# Patient Record
Sex: Female | Born: 1955
Health system: Southern US, Community
[De-identification: ages and names within clinical notes are randomized; demographics above are authoritative.]

## PROBLEM LIST (undated history)

## (undated) DIAGNOSIS — F329 Major depressive disorder, single episode, unspecified: Secondary | ICD-10-CM

## (undated) DIAGNOSIS — K219 Gastro-esophageal reflux disease without esophagitis: Secondary | ICD-10-CM

## (undated) DIAGNOSIS — F419 Anxiety disorder, unspecified: Secondary | ICD-10-CM

## (undated) DIAGNOSIS — M1990 Unspecified osteoarthritis, unspecified site: Secondary | ICD-10-CM

## (undated) DIAGNOSIS — E7800 Pure hypercholesterolemia, unspecified: Secondary | ICD-10-CM

## (undated) DIAGNOSIS — F32A Depression, unspecified: Secondary | ICD-10-CM

## (undated) HISTORY — DX: Anxiety disorder, unspecified: F41.9

## (undated) HISTORY — PX: ABDOMINAL HYSTERECTOMY: SHX81

## (undated) HISTORY — DX: Unspecified osteoarthritis, unspecified site: M19.90

---

## 2011-02-16 DIAGNOSIS — R269 Unspecified abnormalities of gait and mobility: Secondary | ICD-10-CM | POA: Insufficient documentation

## 2011-12-30 DIAGNOSIS — Z860100 Personal history of colon polyps, unspecified: Secondary | ICD-10-CM | POA: Insufficient documentation

## 2013-12-30 DIAGNOSIS — M8580 Other specified disorders of bone density and structure, unspecified site: Secondary | ICD-10-CM | POA: Insufficient documentation

## 2014-12-01 LAB — HM COLONOSCOPY

## 2016-09-02 ENCOUNTER — Emergency Department: Payer: BC Managed Care – PPO

## 2016-09-02 ENCOUNTER — Emergency Department
Admission: EM | Admit: 2016-09-02 | Discharge: 2016-09-02 | Disposition: A | Discharge status: Home / Self Care | Payer: BC Managed Care – PPO | Attending: Emergency Medicine | Admitting: Emergency Medicine

## 2016-09-02 ENCOUNTER — Encounter: Payer: Self-pay | Admitting: Emergency Medicine

## 2016-09-02 DIAGNOSIS — H538 Other visual disturbances: Secondary | ICD-10-CM | POA: Insufficient documentation

## 2016-09-02 DIAGNOSIS — R202 Paresthesia of skin: Secondary | ICD-10-CM | POA: Insufficient documentation

## 2016-09-02 DIAGNOSIS — G44009 Cluster headache syndrome, unspecified, not intractable: Secondary | ICD-10-CM | POA: Diagnosis not present

## 2016-09-02 DIAGNOSIS — R519 Headache, unspecified: Secondary | ICD-10-CM

## 2016-09-02 DIAGNOSIS — R51 Headache: Secondary | ICD-10-CM

## 2016-09-02 DIAGNOSIS — R29810 Facial weakness: Secondary | ICD-10-CM | POA: Diagnosis present

## 2016-09-02 HISTORY — DX: Depression, unspecified: F32.A

## 2016-09-02 HISTORY — DX: Gastro-esophageal reflux disease without esophagitis: K21.9

## 2016-09-02 HISTORY — DX: Major depressive disorder, single episode, unspecified: F32.9

## 2016-09-02 HISTORY — DX: Pure hypercholesterolemia, unspecified: E78.00

## 2016-09-02 LAB — COMPREHENSIVE METABOLIC PANEL
ALBUMIN: 4.4 g/dL (ref 3.5–5.0)
ALK PHOS: 99 U/L (ref 38–126)
ALT: 24 U/L (ref 14–54)
AST: 32 U/L (ref 15–41)
Anion gap: 7 (ref 5–15)
BILIRUBIN TOTAL: 1.3 mg/dL — AB (ref 0.3–1.2)
BUN: 10 mg/dL (ref 6–20)
CALCIUM: 9.3 mg/dL (ref 8.9–10.3)
CO2: 25 mmol/L (ref 22–32)
CREATININE: 0.93 mg/dL (ref 0.44–1.00)
Chloride: 106 mmol/L (ref 101–111)
GFR calc Af Amer: >60 mL/min (ref >60)
GLUCOSE: 90 mg/dL (ref 65–99)
Potassium: 4.5 mmol/L (ref 3.5–5.1)
Sodium: 138 mmol/L (ref 135–145)
TOTAL PROTEIN: 8 g/dL (ref 6.5–8.1)

## 2016-09-02 LAB — URINALYSIS, ROUTINE W REFLEX MICROSCOPIC
BILIRUBIN URINE: NEGATIVE
Bacteria, UA: NONE SEEN
Glucose, UA: NEGATIVE mg/dL
Ketones, ur: NEGATIVE mg/dL
Nitrite: NEGATIVE
Protein, ur: NEGATIVE mg/dL
SPECIFIC GRAVITY, URINE: 1.002 — AB (ref 1.005–1.030)
pH: 6 (ref 5.0–8.0)

## 2016-09-02 LAB — CBC
HEMATOCRIT: 42.7 % (ref 35.0–47.0)
HEMOGLOBIN: 14.2 g/dL (ref 12.0–16.0)
MCH: 29.7 pg (ref 26.0–34.0)
MCHC: 33.3 g/dL (ref 32.0–36.0)
MCV: 89.2 fL (ref 80.0–100.0)
Platelets: 232 10*3/uL (ref 150–440)
RBC: 4.79 MIL/uL (ref 3.80–5.20)
RDW: 14.1 % (ref 11.5–14.5)
WBC: 6.1 10*3/uL (ref 3.6–11.0)

## 2016-09-02 LAB — PROTIME-INR
INR: 0.94
Prothrombin Time: 12.6 seconds (ref 11.4–15.2)

## 2016-09-02 LAB — APTT: aPTT: 33 seconds (ref 24–36)

## 2016-09-02 LAB — DIFFERENTIAL
BASOS ABS: 0 10*3/uL (ref 0–0.1)
Basophils Relative: 1 %
Eosinophils Absolute: 0.1 10*3/uL (ref 0–0.7)
Eosinophils Relative: 1 %
LYMPHS ABS: 2.1 10*3/uL (ref 1.0–3.6)
LYMPHS PCT: 34 %
MONOS PCT: 8 %
Monocytes Absolute: 0.5 10*3/uL (ref 0.2–0.9)
Neutro Abs: 3.4 10*3/uL (ref 1.4–6.5)
Neutrophils Relative %: 56 %

## 2016-09-02 LAB — ETHANOL: Alcohol, Ethyl (B): <5 mg/dL (ref <5)

## 2016-09-02 MED ORDER — PROCHLORPERAZINE EDISYLATE 5 MG/ML IJ SOLN
10.0000 mg | Freq: Once | INTRAMUSCULAR | Status: AC
  Administered 2016-09-02: 10 mg via INTRAVENOUS
  Filled 2016-09-02: qty 2

## 2016-09-02 MED ORDER — PROCHLORPERAZINE MALEATE 10 MG PO TABS: 10.0000 mg | ORAL_TABLET | Freq: Three times a day (TID) | ORAL | 0 refills | Status: DC | PRN

## 2016-09-02 MED ORDER — CEPHALEXIN 500 MG PO CAPS: 500.0000 mg | ORAL_CAPSULE | Freq: Four times a day (QID) | ORAL | 0 refills | Status: AC

## 2016-09-02 NOTE — ED Provider Notes (Signed)
[facility] Emergency Department Provider Note   ____________________________________________   I have reviewed the triage vital signs and the nursing notes.   HISTORY  Chief Complaint Facial Droop; Tingling; and Blurred Vision   History limited by: Not Limited   HPI Sherry FordyceCatherine Alvillar is a 61 y.o. female who presents to the emergency department today from ophthalmology office because of concerns for possible stroke. The patient states that her symptoms started yesterday. It started with about 30 minutes of wavy lines in her field of vision. That has since resolved and shortly afterward she developed a headache. It is located in the right top part of her head. This has been persistent since it started. Patient then is started feeling numbness on the right side of her body. She denies any weakness or change in coordination. Patient denies any history of migraines. She called ophthalmology yesterday had an appointment with him today. They dilated her eyes. They told her to come to the emergency department. The patient denies any recent illness.    Past Medical History:  Diagnosis Date  . Depression   . Elevated cholesterol   . GERD (gastroesophageal reflux disease)     There are no active problems to display for this patient.   Past Surgical History:  Procedure Laterality Date  . ABDOMINAL HYSTERECTOMY      Prior to Admission medications   Not on File    Allergies Biaxin [clarithromycin]  No family history on file.  Social History Social History  Substance Use Topics  . Smoking status: Not on file  . Smokeless tobacco: Not on file  . Alcohol use Not on file    Review of Systems Constitutional: No fever/chills Eyes: No visual changes. ENT: No sore throat. Cardiovascular: Denies chest pain. Respiratory: Denies shortness of breath. Gastrointestinal: No abdominal pain.  No nausea, no vomiting.  No diarrhea.   Genitourinary: Negative for  dysuria. Musculoskeletal: Negative for back pain. Skin: Negative for rash. Neurological: Positive for headache, right sided numbness.  ____________________________________________   PHYSICAL EXAM:  VITAL SIGNS: ED Triage Vitals  Enc Vitals Group     BP 09/02/16 1125 (!) 159/69     Pulse Rate 09/02/16 1125 63     Resp 09/02/16 1125 18     Temp 09/02/16 1125 97.7 F (36.5 C)     Temp Source 09/02/16 1125 Oral     SpO2 09/02/16 1125 97 %     Weight 09/02/16 1125 162 lb (73.5 kg)     Height 09/02/16 1125 5' 3.5" (1.613 m)     Head Circumference --      Peak Flow --      Pain Score 09/02/16 1124 4     Pain Loc --    Constitutional: Alert and oriented. Well appearing and in no distress. Eyes: Conjunctivae are normal.  ENT   Head: Normocephalic and atraumatic.   Nose: No congestion/rhinnorhea.   Mouth/Throat: Mucous membranes are moist.   Neck: No stridor. Hematological/Lymphatic/Immunilogical: No cervical lymphadenopathy. Cardiovascular: Normal rate, regular rhythm.  No murmurs, rubs, or gallops.  Respiratory: Normal respiratory effort without tachypnea nor retractions. Breath sounds are clear and equal bilaterally. No wheezes/rales/rhonchi. Gastrointestinal: Soft and non tender. No rebound. No guarding.  Genitourinary: Deferred Musculoskeletal: Normal range of motion in all extremities. No lower extremity edema. Neurologic:  Normal speech and language. Face symmetric. Strength 5/5 in upper and lower extremities. Subjective decrease in sensation on the right side. Finger to nose normal. Heel over shin normal.  Skin:  Skin is warm, dry and intact. No rash noted. Psychiatric: Mood and affect are normal. Speech and behavior are normal. Patient exhibits appropriate insight and judgment.  ____________________________________________     LABS (pertinent positives/negatives)  Labs Reviewed  COMPREHENSIVE METABOLIC PANEL - Abnormal; Notable for the following:        Result Value   Total Bilirubin 1.3 (*)    All other components within normal limits  URINALYSIS, ROUTINE W REFLEX MICROSCOPIC - Abnormal; Notable for the following:    Color, Urine YELLOW (*)    APPearance CLOUDY (*)    Specific Gravity, Urine 1.002 (*)    Hgb urine dipstick SMALL (*)    Leukocytes, UA LARGE (*)    Squamous Epithelial / LPF 6-30 (*)    All other components within normal limits  ETHANOL  PROTIME-INR  APTT  CBC  DIFFERENTIAL     ____________________________________________   EKG  I, Phineas Semen, attending physician, personally viewed and interpreted this EKG  EKG Time: 1139 Rate: 58 Rhythm: sinus rhythm Axis: normal Intervals: qtc 483 QRS: LBBB ST changes: no st elevation Impression: abnormal ekg   ____________________________________________    RADIOLOGY  MR brain IMPRESSION: No acute abnormality. Minimal chronic white matter change on the right.  ____________________________________________   PROCEDURES  Procedures  ____________________________________________   INITIAL IMPRESSION / ASSESSMENT AND PLAN / ED COURSE  Pertinent labs & imaging results that were available during my care of the patient were reviewed by me and considered in my medical decision making (see chart for details).  Patient presented to the emergency department today because of concerns for headache and right-sided weakness. MRI did not show any signs of acute abnormality. Patient's headache and that symptoms did improve after Compazine. At this point I think complex migraine. This could be driven by what appears to be urinary tract. Will plan on giving patient a prescription for antibiotics. Will also give neurology follow up.  ____________________________________________   FINAL CLINICAL IMPRESSION(S) / ED DIAGNOSES  Final diagnoses:  Nonintractable headache, unspecified chronicity pattern, unspecified headache type     Note: This dictation was  prepared with Dragon dictation. Any transcriptional errors that result from this process are unintentional     Phineas Semen, MD 09/02/16 1439

## 2016-09-02 NOTE — Discharge Instructions (Signed)
Please seek medical attention for any high fevers, chest pain, shortness of breath, change in behavior, persistent vomiting, bloody stool or any other new or concerning symptoms.  

## 2016-09-02 NOTE — ED Notes (Signed)
Patient outside window for code stroke per Lawernce IonGreg Meadows and Sebastian AcheGreg Moyer, no code stroke initiated [phone]

## 2016-09-02 NOTE — ED Triage Notes (Signed)
Pt reports right side tingling, face feeling strange, blurred vision and headache that began yesterday at approximately 3pm. Bilateral strong hand grips in triage. Pt states blurred vision has gotten better. Facial symmetry appears intact.

## 2016-09-02 NOTE — ED Notes (Signed)
Patient speaking with MRI tech.

## 2016-09-02 NOTE — ED Notes (Signed)
Patient transported to MRI 

## 2016-10-07 DIAGNOSIS — G43109 Migraine with aura, not intractable, without status migrainosus: Secondary | ICD-10-CM | POA: Insufficient documentation

## 2017-07-06 LAB — LIPID PANEL
Cholesterol: 147 (ref 0–200)
HDL: 61 (ref 35–70)
LDL Cholesterol: 73
Triglycerides: 64 (ref 40–160)

## 2017-07-06 LAB — TSH: TSH: 1.4 (ref 0.41–5.90)

## 2018-05-11 ENCOUNTER — Ambulatory Visit: Payer: BC Managed Care – PPO | Admitting: Internal Medicine

## 2018-07-14 IMAGING — MR MR HEAD W/O CM
10 series · 41 of 48 positions shown · non-contrast
Comparison: None.

CLINICAL DATA: Right-sided weakness blurred vision

EXAM:
MRI HEAD WITHOUT CONTRAST
TECHNIQUE: Multiplanar, multiecho pulse sequences of the brain and surrounding
structures were obtained without intravenous contrast.

[Series 2: T1 · sagittal · 5.0mm · 0.45mm/px · 3 of 29 slices shown (1 of 2)]
[im 1/29]
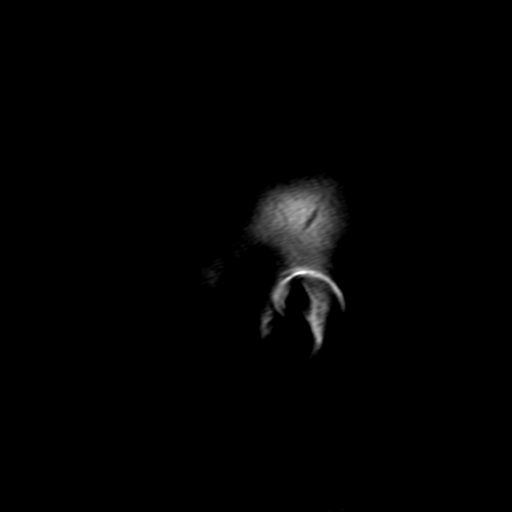
[im 15/29]
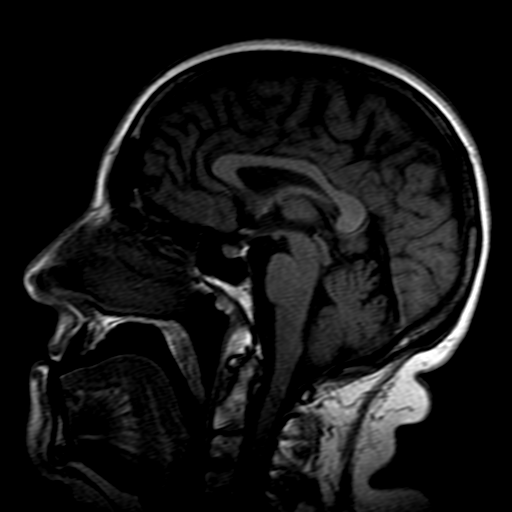
[im 29/29]
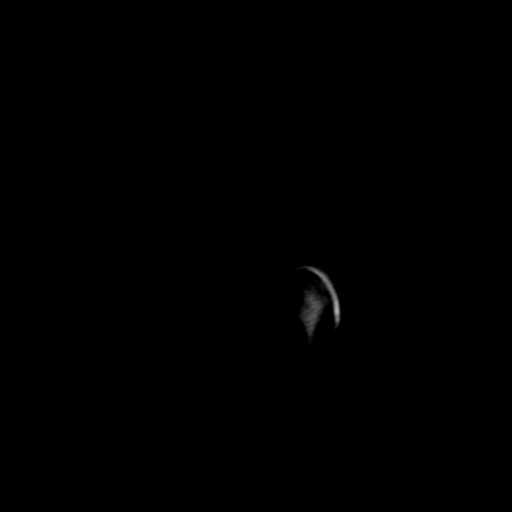

[Series 4: DWI · axial · 4.0mm · 0.94mm/px · z∈[-20,+133]mm · 5 of 41 slices shown (1 of 4)]
[im 1/41]
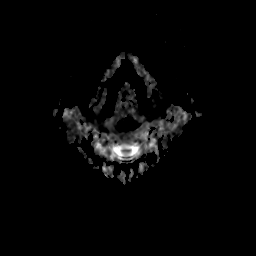
[im 11/41]
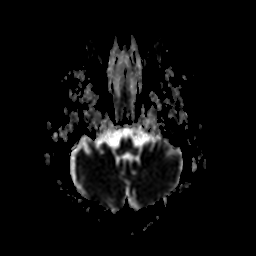
[im 21/41]
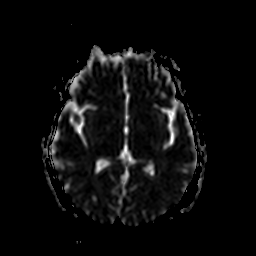
[im 31/41]
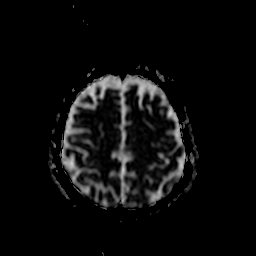
[im 41/41]
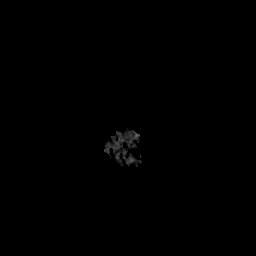

[Series 6: DWI · coronal · 5.0mm · 1.80mm/px · 5 of 36 slices shown (2 of 4)]
[im 1/36]
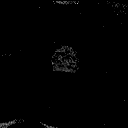
[im 9/36]
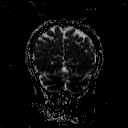
[im 18/36]
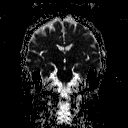
[im 27/36]
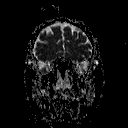
[im 36/36]
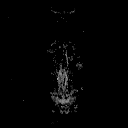

[Series 7: DWI · axial · 4.0mm · 0.94mm/px · z∈[-16,+133]mm · 6 of 40 slices shown (3 of 4)]
[im 1/40]
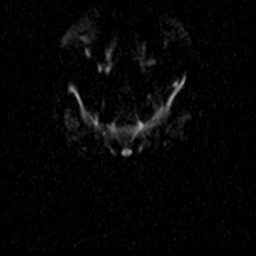
[im 8/40]
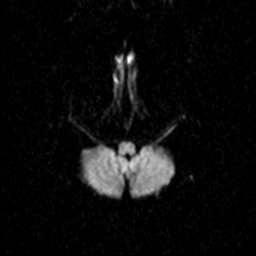
[im 16/40]
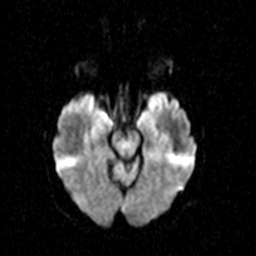
[im 24/40]
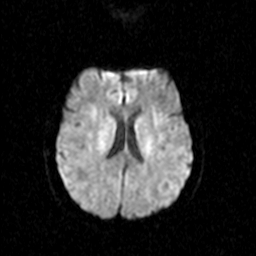
[im 32/40]
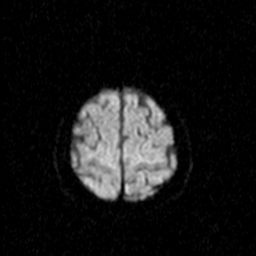
[im 40/40]
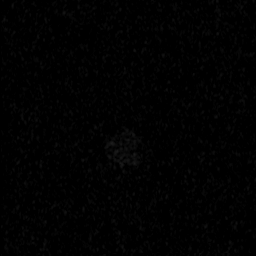

[Series 8: DWI · coronal · 5.0mm · 1.80mm/px · 5 of 33 slices shown (4 of 4)]
[im 1/33]
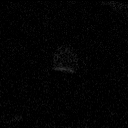
[im 9/33]
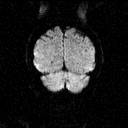
[im 17/33]
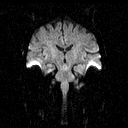
[im 25/33]
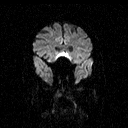
[im 33/33]
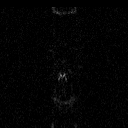

[Series 9: T2 · axial · 5.0mm · 0.45mm/px · z∈[-20,+129]mm · 4 of 25 slices shown (1 of 3)]
[im 1/25]
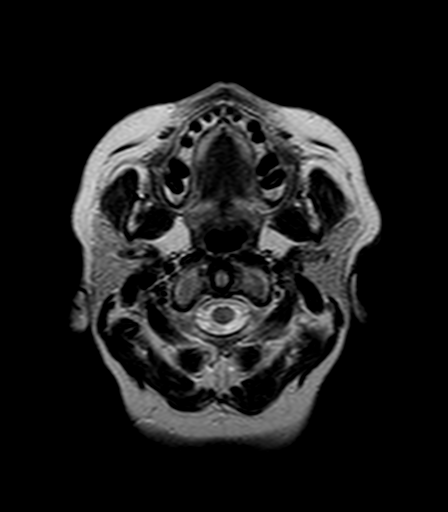
[im 9/25]
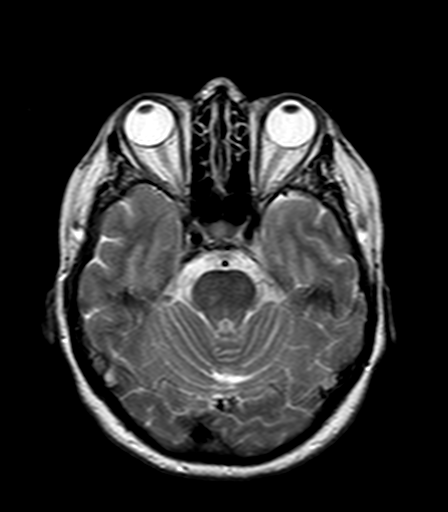
[im 17/25]
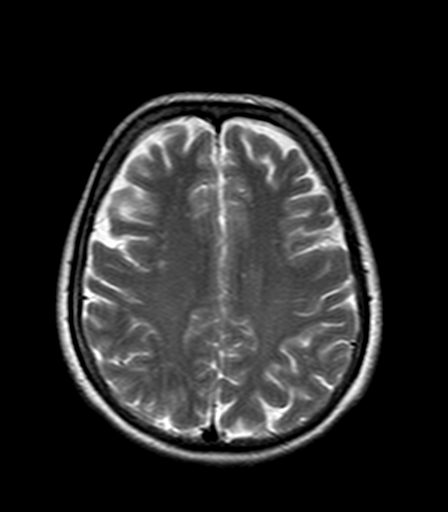
[im 25/25]
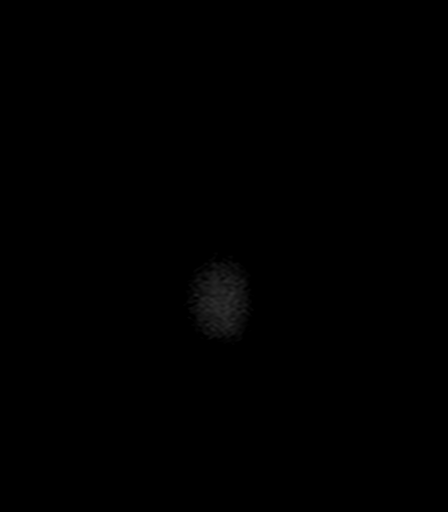

[Series 10: FLAIR · axial · 5.0mm · 0.90mm/px · z∈[-20,+130]mm · 4 of 25 slices shown]
[im 1/25]
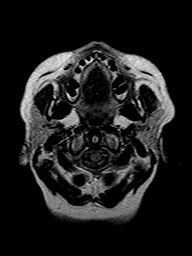
[im 9/25]
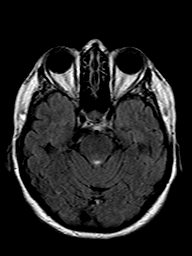
[im 17/25]
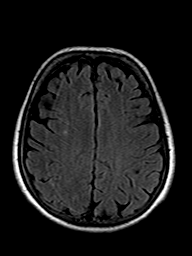
[im 25/25]
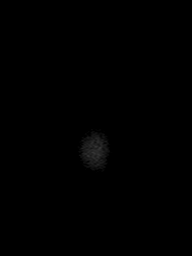

[Series 11: T2 · axial · 5.0mm · 0.45mm/px · z∈[-20,+129]mm · 4 of 25 slices shown (2 of 3)]
[im 1/25]
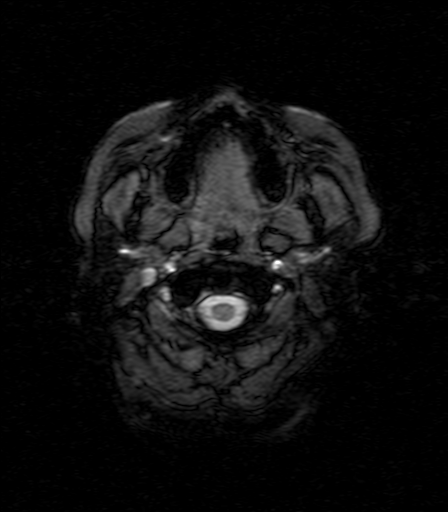
[im 9/25]
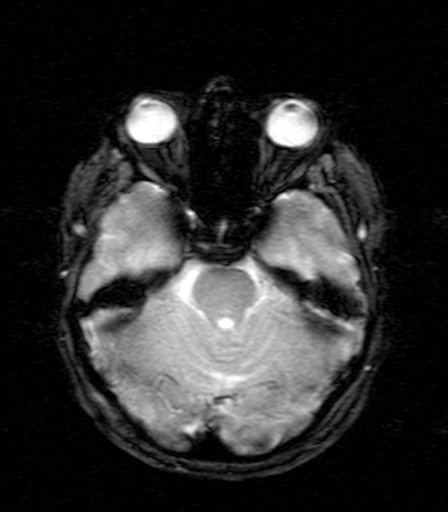
[im 17/25]
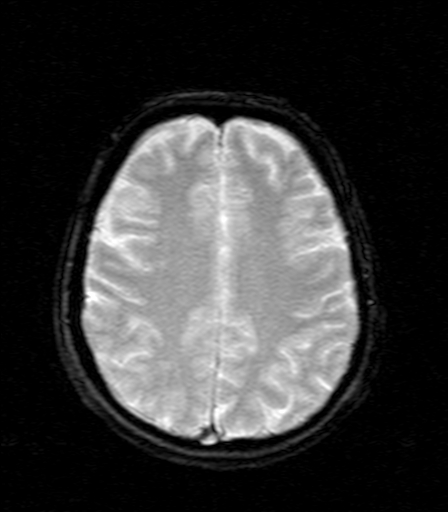
[im 25/25]
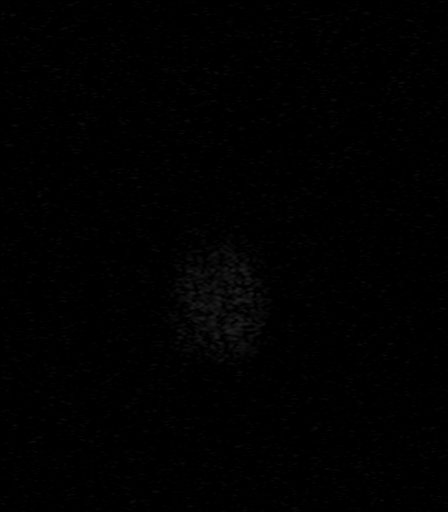

[Series 12: T1 · axial · 3.0mm · 0.45mm/px · 1 of 56 slices shown (2 of 2)]
[im 1/56]
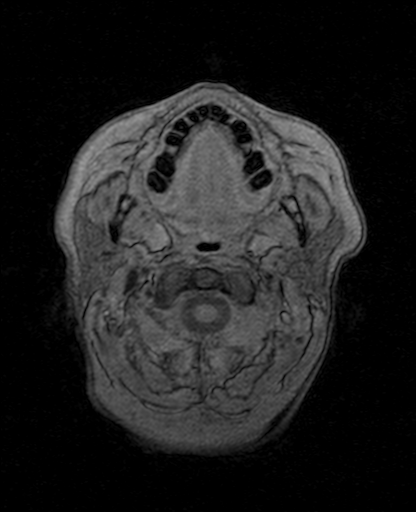

[Series 13: T2 · coronal · 5.0mm · 0.45mm/px · 4 of 27 slices shown (3 of 3)]
[im 1/27]
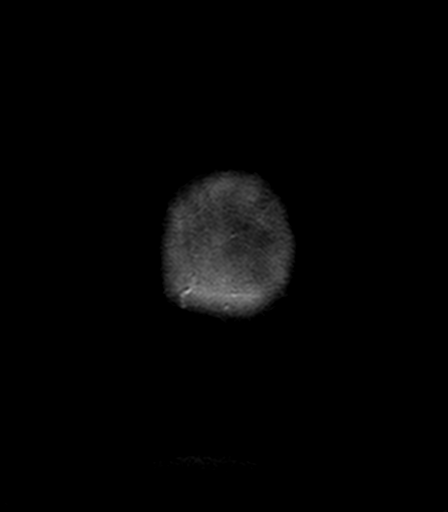
[im 9/27]
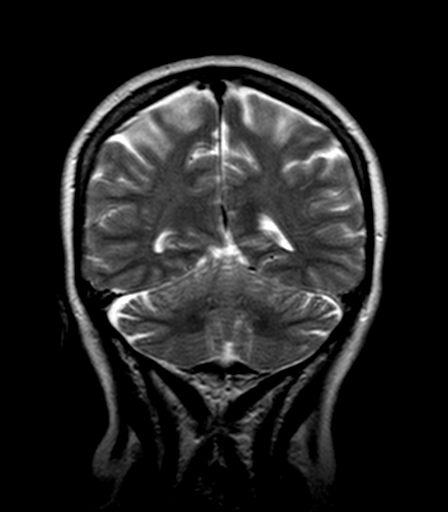
[im 18/27]
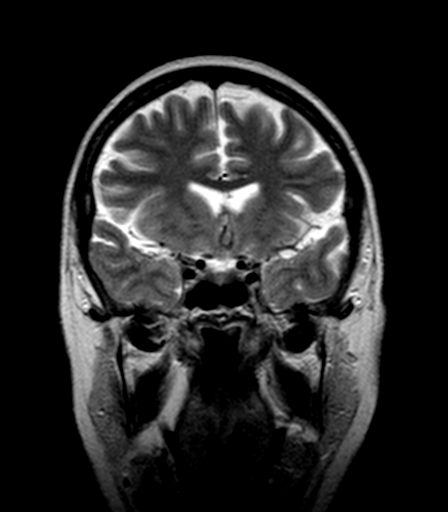
[im 27/27]
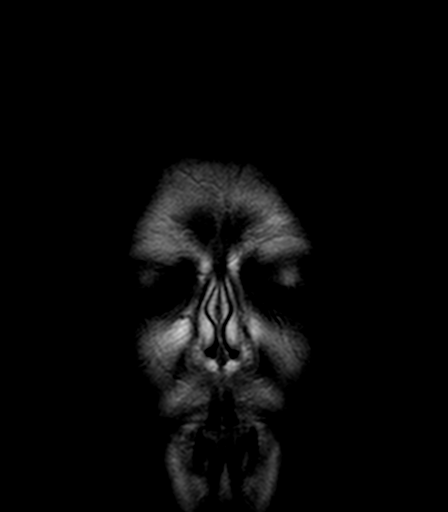

[41 of 48 positions shown; findings below may reference images not displayed]

FINDINGS: Brain: Ventricle size normal. Cerebral volume normal. Negative for
acute infarct. Mild subcortical white matter hyperintensity in the
right frontal lobe. Remainder the white matter normal. Brainstem
normal. Negative for hemorrhage or mass.

Vascular: Normal arterial flow void

Skull and upper cervical spine: Negative

Sinuses/Orbits: Mild mucosal edema paranasal sinuses.  Normal orbit.

Other: None
IMPRESSION: No acute abnormality. Minimal chronic white matter change on the
right.

## 2019-02-26 ENCOUNTER — Ambulatory Visit: Payer: BC Managed Care – PPO | Admitting: Internal Medicine

## 2019-03-14 ENCOUNTER — Encounter: Payer: Self-pay | Admitting: Internal Medicine

## 2019-03-15 ENCOUNTER — Encounter: Payer: Self-pay | Admitting: Internal Medicine

## 2019-03-15 ENCOUNTER — Ambulatory Visit: Payer: BC Managed Care – PPO | Admitting: Internal Medicine

## 2019-03-15 ENCOUNTER — Other Ambulatory Visit: Payer: Self-pay

## 2019-03-15 VITALS — BP 118/70 | HR 79 | Ht 63.5 in | Wt 146.0 lb

## 2019-03-15 DIAGNOSIS — F320 Major depressive disorder, single episode, mild: Secondary | ICD-10-CM | POA: Diagnosis not present

## 2019-03-15 DIAGNOSIS — Z23 Encounter for immunization: Secondary | ICD-10-CM | POA: Diagnosis not present

## 2019-03-15 MED ORDER — ESCITALOPRAM OXALATE 20 MG PO TABS: 20.0000 mg | ORAL_TABLET | Freq: Every day | ORAL | 1 refills | Status: DC

## 2019-03-15 NOTE — Progress Notes (Signed)
Date:  03/15/2019   Name:  Sherry Gibson   DOB:  03/17/1956   MRN:  [id number]   Chief Complaint: Establish Care  Depression        This is a chronic (15-20 years) problem.The problem is unchanged.  Associated symptoms include insomnia.  Associated symptoms include no fatigue, no appetite change and no headaches.  Past treatments include SSRIs - Selective serotonin reuptake inhibitors.  Compliance with treatment is good.  Previous treatment provided significant relief.   Lab Results  Component Value Date   CREATININE 0.93 09/02/2016   BUN 10 09/02/2016   NA 138 09/02/2016   K 4.5 09/02/2016   CL 106 09/02/2016   CO2 25 09/02/2016   Lab Results  Component Value Date   CHOL 147 07/06/2017   HDL 61 07/06/2017   LDLCALC 73 07/06/2017   TRIG 64 07/06/2017   Lab Results  Component Value Date   TSH 1.40 07/06/2017   No results found for: HGBA1C   Review of Systems  Constitutional: Negative for appetite change, fatigue, fever and unexpected weight change.  HENT: Negative for tinnitus and trouble swallowing.   Eyes: Negative for visual disturbance.  Respiratory: Negative for cough, chest tightness and shortness of breath.   Cardiovascular: Negative for chest pain, palpitations and leg swelling.  Gastrointestinal: Negative for abdominal pain.  Endocrine: Negative for polydipsia and polyuria.  Genitourinary: Negative for dysuria and hematuria.  Musculoskeletal: Negative for arthralgias.  Neurological: Negative for tremors, numbness and headaches.  Psychiatric/Behavioral: Positive for depression and sleep disturbance. Negative for dysphoric mood. The patient has insomnia. The patient is not nervous/anxious.     Patient Active Problem List   Diagnosis Date Noted  . Current mild episode of major depressive disorder without prior episode ([place]) 03/15/2019  . Abnormality of gait 02/16/2011    Allergies  Allergen Reactions  . Clarithromycin Nausea And Vomiting    Past  Surgical History:  Procedure Laterality Date  . ABDOMINAL HYSTERECTOMY     had irregular bleeding- still have ovaries    Social History   Tobacco Use  . Smoking status: Never Smoker  . Smokeless tobacco: Never Used  Substance Use Topics  . Alcohol use: Never    Frequency: Never  . Drug use: Never     Medication list has been reviewed and updated.  Current Meds  Medication Sig  . escitalopram (LEXAPRO) 20 MG tablet Take 1 tablet (20 mg total) by mouth daily.  . [DISCONTINUED] escitalopram (LEXAPRO) 20 MG tablet Take 20 mg by mouth daily.    PHQ 2/9 Scores 03/15/2019  PHQ - 2 Score 0  PHQ- 9 Score 8    BP Readings from Last 3 Encounters:  03/15/19 118/70  09/02/16 131/75    Physical Exam Vitals signs and nursing note reviewed.  Constitutional:      General: She is not in acute distress.    Appearance: Normal appearance. She is well-developed.  HENT:     Head: Normocephalic and atraumatic.  Neck:     Musculoskeletal: Normal range of motion.     Vascular: No carotid bruit.  Cardiovascular:     Rate and Rhythm: Normal rate and regular rhythm.     Pulses: Normal pulses.     Heart sounds: No murmur.  Pulmonary:     Effort: Pulmonary effort is normal. No respiratory distress.     Breath sounds: Normal breath sounds.  Musculoskeletal:     Right lower leg: No edema.  Left lower leg: No edema.  Lymphadenopathy:     Cervical: No cervical adenopathy.  Skin:    General: Skin is warm and dry.     Capillary Refill: Capillary refill takes less than 2 seconds.     Findings: No rash.  Neurological:     General: No focal deficit present.     Mental Status: She is alert and oriented to person, place, and time.     Deep Tendon Reflexes:     Reflex Scores:      Bicep reflexes are 2+ on the right side and 2+ on the left side.      Patellar reflexes are 2+ on the right side and 2+ on the left side. Psychiatric:        Behavior: Behavior normal.        Thought Content:  Thought content normal.     Wt Readings from Last 3 Encounters:  03/15/19 146 lb (66.2 kg)  09/02/16 162 lb (73.5 kg)    BP 118/70   Pulse 79   Ht 5' 3.5" (1.613 m)   Wt 146 lb (66.2 kg)   SpO2 96%   BMI 25.46 kg/m   Assessment and Plan: 1. Current mild episode of major depressive disorder without prior episode (HCC) Clinically stable on Lexapro 20 mg Depression is long-standing requiring on-going medications She denies any side effects - escitalopram (LEXAPRO) 20 MG tablet; Take 1 tablet (20 mg total) by mouth daily.  Dispense: 90 tablet; Refill: 1  2. Need for vaccination for pneumococcus Pt will get this at next appt or schedule a Nurse visit sooner if desired   Partially dictated using Dragon software. Any errors are unintentional.  Bari Edward, MD [facility]  03/15/2019

## 2019-05-01 ENCOUNTER — Other Ambulatory Visit: Payer: BC Managed Care – PPO

## 2019-09-04 ENCOUNTER — Ambulatory Visit: Payer: BC Managed Care – PPO | Admitting: Internal Medicine

## 2019-09-04 ENCOUNTER — Encounter: Payer: Self-pay | Admitting: Internal Medicine

## 2019-09-04 ENCOUNTER — Ambulatory Visit: Payer: Self-pay | Admitting: *Deleted

## 2019-09-04 ENCOUNTER — Other Ambulatory Visit: Payer: Self-pay

## 2019-09-04 VITALS — BP 134/82 | HR 70 | Temp 97.7°F | Ht 63.5 in | Wt 151.0 lb

## 2019-09-04 DIAGNOSIS — E7800 Pure hypercholesterolemia, unspecified: Secondary | ICD-10-CM | POA: Insufficient documentation

## 2019-09-04 DIAGNOSIS — K219 Gastro-esophageal reflux disease without esophagitis: Secondary | ICD-10-CM | POA: Insufficient documentation

## 2019-09-04 DIAGNOSIS — F320 Major depressive disorder, single episode, mild: Secondary | ICD-10-CM | POA: Diagnosis not present

## 2019-09-04 DIAGNOSIS — A6000 Herpesviral infection of urogenital system, unspecified: Secondary | ICD-10-CM | POA: Diagnosis not present

## 2019-09-04 DIAGNOSIS — M62838 Other muscle spasm: Secondary | ICD-10-CM | POA: Diagnosis not present

## 2019-09-04 MED ORDER — ESCITALOPRAM OXALATE 20 MG PO TABS: 20.0000 mg | ORAL_TABLET | Freq: Every day | ORAL | 1 refills | Status: DC

## 2019-09-04 MED ORDER — METHOCARBAMOL 750 MG PO TABS: 750.0000 mg | ORAL_TABLET | Freq: Three times a day (TID) | ORAL | 0 refills | Status: DC | PRN

## 2019-09-04 MED ORDER — VALACYCLOVIR HCL 1 G PO TABS: 1000.0000 mg | ORAL_TABLET | Freq: Every day | ORAL | 1 refills | Status: DC

## 2019-09-04 NOTE — Telephone Encounter (Signed)
Patient calls with Right shoulder "heaviness" that began when she woke on 2 days ago. Since, her entire arm feels heavy. Today, the right hand feels numb when holding her arm down by her side. Denies SOB/CP/dizziness/cough/fever. Denies weakness elsewhere. Tried tylenol and heat-helped very little. No accident or injury reported. Mentioned she cleaned house on Saturday then woke up with a sore neck on Monday, neck soreness has since resolved. Messaging with Wendie Simmer to have patient in office at 1:40p. Okay per DT for in office.   Reason for Disposition . Numbness (i.e., loss of sensation) in hand or fingers  Answer Assessment - Initial Assessment Questions 1. ONSET: "When did the pain start?"     monday 2. LOCATION: "Where is the pain located?"     Right shoulder and arm 3. PAIN: "How bad is the pain?" (Scale 1-10; or mild, moderate, severe)   - MILD (1-3): doesn't interfere with normal activities   - MODERATE (4-7): interferes with normal activities (e.g., work or school) or awakens from sleep   - SEVERE (8-10): excruciating pain, unable to do any normal activities, unable to move arm at all due to pain     mild 4. WORK OR EXERCISE: "Has there been any recent work or exercise that involved this part of the body?"     Cleaned house on Saturday. 5. CAUSE: "What do you think is causing the shoulder pain?"    unsure 6. OTHER SYMPTOMS: "Do you have any other symptoms?" (e.g., neck pain, swelling, rash, fever, numbness, weakness)    Right hand numbness at times today 7. PREGNANCY: "Is there any chance you are pregnant?" "When was your last menstrual period?"     na  Protocols used: SHOULDER PAIN-A-AH

## 2019-09-04 NOTE — Progress Notes (Signed)
Date:  09/04/2019   Name:  Sherry Gibson   DOB:  10/20/1955   MRN:  [id number]   Chief Complaint: Shoulder Pain (right shoulder, moved chairs around on saturday but did not lift them, monday morning pain in neck took tylenol helped but the pain moved from neck to [place] and runs all the way down to her fingers, painful to move shoulder and fingers)  Shoulder Pain  The pain is present in the right shoulder and neck. This is a new problem. The current episode started yesterday. The problem occurs constantly. The quality of the pain is described as aching. The pain is moderate. Associated symptoms include stiffness. Pertinent negatives include no fever, inability to bear weight, joint swelling, limited range of motion or numbness. The symptoms are aggravated by activity. She has tried heat and acetaminophen for the symptoms. The treatment provided mild relief.  Depression        This is a chronic problem.  Associated symptoms include myalgias.  Associated symptoms include no fatigue and no headaches.  Past treatments include SSRIs - Selective serotonin reuptake inhibitors.  Compliance with treatment is good.  Previous treatment provided significant relief.   Lab Results  Component Value Date   CREATININE 0.93 09/02/2016   BUN 10 09/02/2016   NA 138 09/02/2016   K 4.5 09/02/2016   CL 106 09/02/2016   CO2 25 09/02/2016   Lab Results  Component Value Date   CHOL 147 07/06/2017   HDL 61 07/06/2017   LDLCALC 73 07/06/2017   TRIG 64 07/06/2017   Lab Results  Component Value Date   TSH 1.40 07/06/2017   No results found for: HGBA1C Lab Results  Component Value Date   WBC 6.1 09/02/2016   HGB 14.2 09/02/2016   HCT 42.7 09/02/2016   MCV 89.2 09/02/2016   PLT 232 09/02/2016   Lab Results  Component Value Date   ALT 24 09/02/2016   AST 32 09/02/2016   ALKPHOS 99 09/02/2016   BILITOT 1.3 (H) 09/02/2016     Review of Systems  Constitutional: Negative for chills, fatigue  and fever.  Respiratory: Negative for cough, chest tightness and shortness of breath.   Cardiovascular: Negative for chest pain.  Musculoskeletal: Positive for myalgias, neck pain, neck stiffness and stiffness.  Neurological: Negative for dizziness, weakness, numbness and headaches.  Psychiatric/Behavioral: Positive for depression.    Patient Active Problem List   Diagnosis Date Noted  . Current mild episode of major depressive disorder without prior episode ([place]) 03/15/2019  . Abnormality of gait 02/16/2011    Allergies  Allergen Reactions  . Clarithromycin Nausea And Vomiting    Past Surgical History:  Procedure Laterality Date  . ABDOMINAL HYSTERECTOMY     had irregular bleeding- still have ovaries    Social History   Tobacco Use  . Smoking status: Never Smoker  . Smokeless tobacco: Never Used  Substance Use Topics  . Alcohol use: Never  . Drug use: Never     Medication list has been reviewed and updated.  Current Meds  Medication Sig  . escitalopram (LEXAPRO) 20 MG tablet Take 1 tablet (20 mg total) by mouth daily.    PHQ 2/9 Scores 09/04/2019 03/15/2019  PHQ - 2 Score 0 0  PHQ- 9 Score 0 8    BP Readings from Last 3 Encounters:  09/04/19 134/82  03/15/19 118/70  09/02/16 131/75    Physical Exam Vitals and nursing note reviewed.  Constitutional:  General: She is not in acute distress.    Appearance: Normal appearance. She is well-developed.  HENT:     Head: Normocephalic and atraumatic.  Cardiovascular:     Rate and Rhythm: Normal rate and regular rhythm.  Pulmonary:     Effort: Pulmonary effort is normal. No respiratory distress.     Breath sounds: Normal breath sounds and air entry.  Musculoskeletal:     Right shoulder: Normal.     Left shoulder: Normal.     Cervical back: Spasms and tenderness present. Decreased range of motion.  Skin:    General: Skin is warm and dry.     Findings: No rash.  Neurological:     Mental Status: She is  alert and oriented to person, place, and time.     Cranial Nerves: Cranial nerves are intact.     Sensory: Sensation is intact.     Motor: Motor function is intact.     Deep Tendon Reflexes:     Reflex Scores:      Bicep reflexes are 2+ on the right side and 2+ on the left side. Psychiatric:        Attention and Perception: Attention normal.        Mood and Affect: Mood normal.     Wt Readings from Last 3 Encounters:  09/04/19 151 lb (68.5 kg)  03/15/19 146 lb (66.2 kg)  09/02/16 162 lb (73.5 kg)    BP 134/82   Pulse 70   Temp 97.7 F (36.5 C) (Oral)   Ht 5' 3.5" (1.613 m)   Wt 151 lb (68.5 kg)   SpO2 97%   BMI 26.33 kg/m   Assessment and Plan: 1. Muscle spasms of neck Continue Tylenol 3-4 times per day with heat for 30 min 3-4 times per day Use robaxin at HS and bid prn - methocarbamol (ROBAXIN-750) 750 MG tablet; Take 1 tablet (750 mg total) by mouth every 8 (eight) hours as needed for muscle spasms.  Dispense: 30 tablet; Refill: 0  2. Current mild episode of major depressive disorder without prior episode (HCC) Clinically stable on current regimen with good control of symptoms, No SI or HI. Will continue current therapy. - escitalopram (LEXAPRO) 20 MG tablet; Take 1 tablet (20 mg total) by mouth daily.  Dispense: 90 tablet; Refill: 1  3. Genital herpes simplex, unspecified site Continue daily Valtrex for suppression - valACYclovir (VALTREX) 1000 MG tablet; Take 1 tablet (1,000 mg total) by mouth daily.  Dispense: 90 tablet; Refill: 1   Partially dictated using Animal nutritionist. Any errors are unintentional.  Bari Edward, MD [facility]  09/04/2019

## 2019-11-20 NOTE — Progress Notes (Signed)
Date:  11/21/2019   Name:  Sherry Gibson   DOB:  06/23/1955   MRN:  [id number]   Chief Complaint: Annual Exam (breast exam no pap/ [id number])  Sherry Gibson is a 64 y.o. female who presents today for her Complete Annual Exam. She feels well. She reports exercising none. She reports she is sleeping fairly well. Breast complaints none.  Mammogram: 12/2018 DEXA: 2017 result not available Pap smear: discontinued Colonoscopy: 11/2014 repeat 10 yrs  Immunization History  Administered Date(s) Administered  . Influenza Inj Mdck Quad Pf 02/19/2017  . Influenza, High Dose Seasonal PF 01/17/2019  . Influenza-Unspecified 12/29/2011, 01/22/2013, 01/22/2014  . PFIZER SARS-COV-2 Vaccination 05/05/2019, 05/26/2019  . Pneumococcal Conjugate-13 11/21/2019  . Tdap 06/06/2008  . Zoster 08/28/2014  . Zoster Recombinat (Shingrix) 10/24/2018, 01/12/2019    Depression        This is a chronic problem.  The problem has been resolved since onset.  Associated symptoms include no fatigue and no headaches.  Past treatments include SSRIs - Selective serotonin reuptake inhibitors.  Compliance with treatment is good.  Previous treatment provided significant relief.  Risk factors include prior traumatic experience.    Lab Results  Component Value Date   CREATININE 0.93 09/02/2016   BUN 10 09/02/2016   NA 138 09/02/2016   K 4.5 09/02/2016   CL 106 09/02/2016   CO2 25 09/02/2016   Lab Results  Component Value Date   CHOL 147 07/06/2017   HDL 61 07/06/2017   LDLCALC 73 07/06/2017   TRIG 64 07/06/2017   Lab Results  Component Value Date   TSH 1.40 07/06/2017   No results found for: HGBA1C Lab Results  Component Value Date   WBC 6.1 09/02/2016   HGB 14.2 09/02/2016   HCT 42.7 09/02/2016   MCV 89.2 09/02/2016   PLT 232 09/02/2016   Lab Results  Component Value Date   ALT 24 09/02/2016   AST 32 09/02/2016   ALKPHOS 99 09/02/2016   BILITOT 1.3 (H) 09/02/2016     Review of Systems    Constitutional: Negative for chills, fatigue and fever.  HENT: Negative for congestion, hearing loss, tinnitus, trouble swallowing and voice change.   Eyes: Negative for visual disturbance.  Respiratory: Negative for cough, chest tightness, shortness of breath and wheezing.   Cardiovascular: Negative for chest pain, palpitations and leg swelling.  Gastrointestinal: Negative for abdominal pain, constipation, diarrhea and vomiting.  Endocrine: Negative for polydipsia and polyuria.  Genitourinary: Positive for genital sores (intermittent herpes outbreaks). Negative for dysuria, frequency, vaginal bleeding and vaginal discharge.  Musculoskeletal: Negative for arthralgias, gait problem and joint swelling.  Skin: Negative for color change and rash.  Neurological: Negative for dizziness, tremors, light-headedness and headaches.  Hematological: Negative for adenopathy. Does not bruise/bleed easily.  Psychiatric/Behavioral: Positive for depression. Negative for dysphoric mood and sleep disturbance. The patient is not nervous/anxious.     Patient Active Problem List   Diagnosis Date Noted  . Genital herpes simplex 09/04/2019  . GERD (gastroesophageal reflux disease) 09/04/2019  . Hypercholesterolemia 09/04/2019  . Current mild episode of major depressive disorder without prior episode (HCC) 03/15/2019  . Migraine with aura 10/07/2016  . Osteopenia 12/30/2013  . Personal history of colonic polyps 12/30/2011  . Abnormality of gait 02/16/2011    Allergies  Allergen Reactions  . Clarithromycin Nausea And Vomiting    Past Surgical History:  Procedure Laterality Date  . ABDOMINAL HYSTERECTOMY     had irregular bleeding- still have ovaries  Social History   Tobacco Use  . Smoking status: Never Smoker  . Smokeless tobacco: Never Used  Vaping Use  . Vaping Use: Never used  Substance Use Topics  . Alcohol use: Never  . Drug use: Never     Medication list has been reviewed and  updated.  Current Meds  Medication Sig  . Calcium Carbonate-Vit D-Min (CALCIUM 1200 PO) Take 1 tablet by mouth daily.  [name] Kitchen escitalopram (LEXAPRO) 20 MG tablet Take 1 tablet (20 mg total) by mouth daily.  . methocarbamol (ROBAXIN-750) 750 MG tablet Take 1 tablet (750 mg total) by mouth every 8 (eight) hours as needed for muscle spasms.  . valACYclovir (VALTREX) 1000 MG tablet Take 1 tablet (1,000 mg total) by mouth daily.    PHQ 2/9 Scores 11/21/2019 09/04/2019 03/15/2019  PHQ - 2 Score 0 0 0  PHQ- 9 Score 0 0 8    GAD 7 : Generalized Anxiety Score 09/04/2019  Nervous, Anxious, on Edge 0  Control/stop worrying 0  Worry too much - different things 0  Trouble relaxing 0  Restless 0  Easily annoyed or irritable 0  Afraid - awful might happen 0  Total GAD 7 Score 0  Anxiety Difficulty Not difficult at all    BP Readings from Last 3 Encounters:  11/21/19 120/78  09/04/19 134/82  03/15/19 118/70    Physical Exam Vitals and nursing note reviewed.  Constitutional:      General: She is not in acute distress.    Appearance: She is well-developed.  HENT:     Head: Normocephalic and atraumatic.     Right Ear: Tympanic membrane and ear canal normal.     Left Ear: Tympanic membrane and ear canal normal.     Nose:     Right Sinus: No maxillary sinus tenderness.     Left Sinus: No maxillary sinus tenderness.  Eyes:     General: No scleral icterus.       Right eye: No discharge.        Left eye: No discharge.     Conjunctiva/sclera: Conjunctivae normal.  Neck:     Thyroid: No thyromegaly.     Vascular: No carotid bruit.  Cardiovascular:     Rate and Rhythm: Normal rate and regular rhythm.     Pulses: Normal pulses.     Heart sounds: Normal heart sounds.  Pulmonary:     Effort: Pulmonary effort is normal. No respiratory distress.     Breath sounds: No wheezing.  Chest:     Breasts:        Right: No mass, nipple discharge, skin change or tenderness.        Left: No mass, nipple  discharge, skin change or tenderness.  Abdominal:     General: Bowel sounds are normal.     Palpations: Abdomen is soft.     Tenderness: There is no abdominal tenderness.  Musculoskeletal:     Cervical back: Normal range of motion. No erythema.     Right lower leg: No edema.     Left lower leg: No edema.  Lymphadenopathy:     Cervical: No cervical adenopathy.  Skin:    General: Skin is warm and dry.     Findings: No rash.  Neurological:     Mental Status: She is alert and oriented to person, place, and time.     Cranial Nerves: No cranial nerve deficit.     Sensory: No sensory deficit.     Deep Tendon  Reflexes: Reflexes are normal and symmetric.  Psychiatric:        Attention and Perception: Attention normal.        Mood and Affect: Mood normal.     Wt Readings from Last 3 Encounters:  11/21/19 153 lb (69.4 kg)  09/04/19 151 lb (68.5 kg)  03/15/19 146 lb (66.2 kg)    BP 120/78   Pulse 69   Temp 98.3 F (36.8 C) (Oral)   Ht 5' 3.5" (1.613 m)   Wt 153 lb (69.4 kg)   SpO2 96%   BMI 26.68 kg/m   Assessment and Plan: 1. Annual physical exam Normal exam Recommend regular exercise and diet changes - CBC with Differential/Platelet - Comprehensive metabolic panel - Lipid panel - TSH - POCT urinalysis dipstick  2. Encounter for screening mammogram for breast cancer To be scheduled at [facility] - MM 3D SCREEN BREAST BILATERAL; Future  3. Encounter for screening for osteoporosis To be scheduled at [facility] - DG Bone Density; Future  4. Need for vaccination for pneumococcus - Pneumococcal conjugate vaccine 13-valent IM  5. Current mild episode of major depressive disorder without prior episode (HCC) Clinically stable on current regimen with good control of symptoms, No SI or HI. Will continue current therapy.   Partially dictated using Animal nutritionist. Any errors are unintentional.  Bari Edward, MD [facility]  11/21/2019

## 2019-11-21 ENCOUNTER — Ambulatory Visit (INDEPENDENT_AMBULATORY_CARE_PROVIDER_SITE_OTHER): Payer: BC Managed Care – PPO | Admitting: Internal Medicine

## 2019-11-21 ENCOUNTER — Other Ambulatory Visit: Payer: Self-pay

## 2019-11-21 ENCOUNTER — Encounter: Payer: Self-pay | Admitting: Internal Medicine

## 2019-11-21 VITALS — BP 120/78 | HR 69 | Temp 98.3°F | Ht 63.5 in | Wt 153.0 lb

## 2019-11-21 DIAGNOSIS — Z13820 Encounter for screening for osteoporosis: Secondary | ICD-10-CM

## 2019-11-21 DIAGNOSIS — F320 Major depressive disorder, single episode, mild: Secondary | ICD-10-CM

## 2019-11-21 DIAGNOSIS — Z0000 Encounter for general adult medical examination without abnormal findings: Secondary | ICD-10-CM | POA: Diagnosis not present

## 2019-11-21 DIAGNOSIS — Z23 Encounter for immunization: Secondary | ICD-10-CM

## 2019-11-21 DIAGNOSIS — Z1231 Encounter for screening mammogram for malignant neoplasm of breast: Secondary | ICD-10-CM | POA: Diagnosis not present

## 2019-11-21 LAB — POCT URINALYSIS DIPSTICK
Bilirubin, UA: NEGATIVE
Blood, UA: NEGATIVE
Glucose, UA: NEGATIVE
Ketones, UA: NEGATIVE
Leukocytes, UA: NEGATIVE
Nitrite, UA: NEGATIVE
Protein, UA: NEGATIVE
Spec Grav, UA: <=1.005 — AB (ref 1.010–1.025)
Urobilinogen, UA: 0.2 E.U./dL
pH, UA: 6 (ref 5.0–8.0)

## 2019-11-22 LAB — CBC WITH DIFFERENTIAL/PLATELET
Basophils Absolute: 0 10*3/uL (ref 0.0–0.2)
Basos: 0 %
EOS (ABSOLUTE): 0.1 10*3/uL (ref 0.0–0.4)
Eos: 1 %
Hematocrit: 40.1 % (ref 34.0–46.6)
Hemoglobin: 13.3 g/dL (ref 11.1–15.9)
Immature Grans (Abs): 0 10*3/uL (ref 0.0–0.1)
Immature Granulocytes: 0 %
Lymphocytes Absolute: 2 10*3/uL (ref 0.7–3.1)
Lymphs: 35 %
MCH: 30.9 pg (ref 26.6–33.0)
MCHC: 33.2 g/dL (ref 31.5–35.7)
MCV: 93 fL (ref 79–97)
Monocytes Absolute: 0.5 10*3/uL (ref 0.1–0.9)
Monocytes: 8 %
Neutrophils Absolute: 3.3 10*3/uL (ref 1.4–7.0)
Neutrophils: 56 %
Platelets: 257 10*3/uL (ref 150–450)
RBC: 4.3 x10E6/uL (ref 3.77–5.28)
RDW: 13.1 % (ref 11.7–15.4)
WBC: 5.9 10*3/uL (ref 3.4–10.8)

## 2019-11-22 LAB — COMPREHENSIVE METABOLIC PANEL
ALT: 13 IU/L (ref 0–32)
AST: 16 IU/L (ref 0–40)
Albumin/Globulin Ratio: 2.3 — ABNORMAL HIGH (ref 1.2–2.2)
Albumin: 4.5 g/dL (ref 3.8–4.8)
Alkaline Phosphatase: 95 IU/L (ref 48–121)
BUN/Creatinine Ratio: 15 (ref 12–28)
BUN: 12 mg/dL (ref 8–27)
Bilirubin Total: 0.3 mg/dL (ref 0.0–1.2)
CO2: 24 mmol/L (ref 20–29)
Calcium: 9.2 mg/dL (ref 8.7–10.3)
Chloride: 100 mmol/L (ref 96–106)
Creatinine, Ser: 0.82 mg/dL (ref 0.57–1.00)
GFR calc Af Amer: 86 mL/min/{1.73_m2} (ref >59)
GFR calc non Af Amer: 75 mL/min/{1.73_m2} (ref >59)
Globulin, Total: 2 g/dL (ref 1.5–4.5)
Glucose: 82 mg/dL (ref 65–99)
Potassium: 4.5 mmol/L (ref 3.5–5.2)
Sodium: 139 mmol/L (ref 134–144)
Total Protein: 6.5 g/dL (ref 6.0–8.5)

## 2019-11-22 LAB — TSH: TSH: 1.39 u[IU]/mL (ref 0.450–4.500)

## 2019-11-22 LAB — LIPID PANEL
Chol/HDL Ratio: 5.7 ratio — ABNORMAL HIGH (ref 0.0–4.4)
Cholesterol, Total: 260 mg/dL — ABNORMAL HIGH (ref 100–199)
HDL: 46 mg/dL (ref >39)
LDL Chol Calc (NIH): 174 mg/dL — ABNORMAL HIGH (ref 0–99)
Triglycerides: 211 mg/dL — ABNORMAL HIGH (ref 0–149)
VLDL Cholesterol Cal: 40 mg/dL (ref 5–40)

## 2020-01-27 ENCOUNTER — Ambulatory Visit: Payer: BC Managed Care – PPO

## 2020-02-02 ENCOUNTER — Telehealth: Payer: Self-pay | Admitting: Internal Medicine

## 2020-02-02 NOTE — Telephone Encounter (Signed)
Bone density shows slight worsening of osteopenia.  Continue calcium and vitamin D and exercise as able.  Repeat DEXA in 2 years.

## 2020-02-03 ENCOUNTER — Ambulatory Visit: Payer: BC Managed Care – PPO | Attending: Internal Medicine

## 2020-02-03 DIAGNOSIS — Z23 Encounter for immunization: Secondary | ICD-10-CM

## 2020-02-03 NOTE — Progress Notes (Signed)
   Covid-19 Vaccination Clinic  Name:  Sherry Gibson    MRN: [id number] DOB: 01/24/1956  02/03/2020  Ms. Sherry Gibson was observed post Covid-19 immunization for 15 minutes without incident. She was provided with Vaccine Information Sheet and instruction to access the V-Safe system.   Ms. Sherry Gibson was instructed to call 911 with any severe reactions post vaccine: [name] Kitchen Difficulty breathing  . Swelling of face and throat  . A fast heartbeat  . A bad rash all over body  . Dizziness and weakness

## 2020-02-03 NOTE — Telephone Encounter (Signed)
Tried calling pt about results. Unable to leave VM - please try again tomorrow.  CM

## 2020-02-04 ENCOUNTER — Other Ambulatory Visit: Payer: Self-pay

## 2020-02-04 DIAGNOSIS — Z13820 Encounter for screening for osteoporosis: Secondary | ICD-10-CM

## 2020-02-04 DIAGNOSIS — Z1231 Encounter for screening mammogram for malignant neoplasm of breast: Secondary | ICD-10-CM

## 2020-02-04 NOTE — Telephone Encounter (Signed)
Called pt with results. Slightly worse osteopenia. Continue calcium and vitamin D. Try to exercise when able. Will repeat in 2 years. Pt verbalized understanding.  KP

## 2020-02-10 ENCOUNTER — Telehealth: Payer: Self-pay | Admitting: Internal Medicine

## 2020-02-10 NOTE — Telephone Encounter (Signed)
Bone density shows essentially stable osteopenia at the hip and spine.  Continue calcium and vitamin D and exercise.  Repeat in 2-3 years.

## 2020-02-11 NOTE — Telephone Encounter (Signed)
Called and left VM with patient informing of stable bone density from last time. Continue calcium and vit D daily and repeat bone density in 2-3 years.

## 2020-02-13 ENCOUNTER — Encounter: Payer: Self-pay | Admitting: Internal Medicine

## 2020-02-24 ENCOUNTER — Encounter: Payer: Self-pay | Admitting: Internal Medicine

## 2020-02-24 ENCOUNTER — Ambulatory Visit: Payer: BC Managed Care – PPO | Admitting: Internal Medicine

## 2020-02-24 ENCOUNTER — Other Ambulatory Visit: Payer: Self-pay

## 2020-02-24 VITALS — BP 126/62 | HR 69 | Temp 98.1°F | Ht 63.5 in | Wt 153.0 lb

## 2020-02-24 DIAGNOSIS — Z23 Encounter for immunization: Secondary | ICD-10-CM | POA: Diagnosis not present

## 2020-02-24 DIAGNOSIS — F320 Major depressive disorder, single episode, mild: Secondary | ICD-10-CM | POA: Diagnosis not present

## 2020-02-24 DIAGNOSIS — F40243 Fear of flying: Secondary | ICD-10-CM | POA: Diagnosis not present

## 2020-02-24 MED ORDER — ALPRAZOLAM 0.25 MG PO TABS: 0.2500 mg | ORAL_TABLET | Freq: Two times a day (BID) | ORAL | 0 refills | Status: DC | PRN

## 2020-02-24 MED ORDER — ESCITALOPRAM OXALATE 20 MG PO TABS: 20.0000 mg | ORAL_TABLET | Freq: Every day | ORAL | 1 refills | Status: DC

## 2020-02-24 NOTE — Progress Notes (Signed)
Date:  02/24/2020   Name:  Sherry Gibson   DOB:  12/26/1955   MRN:  [id number]   Chief Complaint: Anxiety (Flight Anxiety. Patient requesting medication to get her through a trip she will be going on Dec 15th) and Immunizations (High dose flu shot.)  Anxiety Presents for initial visit. Onset was more than 5 years ago. The problem has been unchanged (fear of flying). Patient reports no chest pain, dizziness, nervous/anxious behavior, palpitations or shortness of breath. Duration: planning a flight to [place] next month and needs medication to help.   Compliance with prior treatments: did well with low dose xanax in the past.    Lab Results  Component Value Date   CREATININE 0.82 11/21/2019   BUN 12 11/21/2019   NA 139 11/21/2019   K 4.5 11/21/2019   CL 100 11/21/2019   CO2 24 11/21/2019   Lab Results  Component Value Date   CHOL 260 (H) 11/21/2019   HDL 46 11/21/2019   LDLCALC 174 (H) 11/21/2019   TRIG 211 (H) 11/21/2019   CHOLHDL 5.7 (H) 11/21/2019   Lab Results  Component Value Date   TSH 1.390 11/21/2019   No results found for: HGBA1C Lab Results  Component Value Date   WBC 5.9 11/21/2019   HGB 13.3 11/21/2019   HCT 40.1 11/21/2019   MCV 93 11/21/2019   PLT 257 11/21/2019   Lab Results  Component Value Date   ALT 13 11/21/2019   AST 16 11/21/2019   ALKPHOS 95 11/21/2019   BILITOT 0.3 11/21/2019     Review of Systems  Constitutional: Negative for chills and fatigue.  Respiratory: Negative for cough, chest tightness and shortness of breath.   Cardiovascular: Negative for chest pain, palpitations and leg swelling.  Neurological: Negative for dizziness and headaches.  Psychiatric/Behavioral: Negative for dysphoric mood. The patient is not nervous/anxious.     Patient Active Problem List   Diagnosis Date Noted  . Genital herpes simplex 09/04/2019  . GERD (gastroesophageal reflux disease) 09/04/2019  . Hypercholesterolemia 09/04/2019  . Current mild  episode of major depressive disorder without prior episode (HCC) 03/15/2019  . Migraine with aura 10/07/2016  . Osteopenia 12/30/2013  . Personal history of colonic polyps 12/30/2011  . Abnormality of gait 02/16/2011    Allergies  Allergen Reactions  . Clarithromycin Nausea And Vomiting    Past Surgical History:  Procedure Laterality Date  . ABDOMINAL HYSTERECTOMY     had irregular bleeding- still have ovaries    Social History   Tobacco Use  . Smoking status: Never Smoker  . Smokeless tobacco: Never Used  Vaping Use  . Vaping Use: Never used  Substance Use Topics  . Alcohol use: Never  . Drug use: Never     Medication list has been reviewed and updated.  Current Meds  Medication Sig  . Calcium Carbonate-Vit D-Min (CALCIUM 1200 PO) Take 1 tablet by mouth daily.  [name] Kitchen escitalopram (LEXAPRO) 20 MG tablet Take 1 tablet (20 mg total) by mouth daily.  . valACYclovir (VALTREX) 1000 MG tablet Take 1 tablet (1,000 mg total) by mouth daily.    PHQ 2/9 Scores 02/24/2020 11/21/2019 09/04/2019 03/15/2019  PHQ - 2 Score 0 0 0 0  PHQ- 9 Score 0 0 0 8    GAD 7 : Generalized Anxiety Score 02/24/2020 09/04/2019  Nervous, Anxious, on Edge 0 0  Control/stop worrying 0 0  Worry too much - different things 0 0  Trouble relaxing 0 0  Restless  0 0  Easily annoyed or irritable 0 0  Afraid - awful might happen 0 0  Total GAD 7 Score 0 0  Anxiety Difficulty Not difficult at all Not difficult at all    BP Readings from Last 3 Encounters:  02/24/20 126/62  11/21/19 120/78  09/04/19 134/82    Physical Exam Vitals and nursing note reviewed.  Constitutional:      General: She is not in acute distress.    Appearance: Normal appearance. She is well-developed.  HENT:     Head: Normocephalic and atraumatic.  Cardiovascular:     Rate and Rhythm: Normal rate and regular rhythm.     Heart sounds: No murmur heard.   Pulmonary:     Effort: Pulmonary effort is normal. No respiratory  distress.     Breath sounds: No wheezing or rhonchi.  Musculoskeletal:        General: Normal range of motion.     Right lower leg: No edema.     Left lower leg: No edema.  Skin:    General: Skin is warm and dry.     Findings: No rash.  Neurological:     Mental Status: She is alert and oriented to person, place, and time.  Psychiatric:        Behavior: Behavior normal.        Thought Content: Thought content normal.     Wt Readings from Last 3 Encounters:  02/24/20 153 lb (69.4 kg)  11/21/19 153 lb (69.4 kg)  09/04/19 151 lb (68.5 kg)    BP 126/62   Pulse 69   Temp 98.1 F (36.7 C) (Oral)   Ht 5' 3.5" (1.613 m)   Wt 153 lb (69.4 kg)   SpO2 96%   BMI 26.68 kg/m   Assessment and Plan: 1. Fear of flying Use Xanax every 6 hours as needed while traveling - ALPRAZolam (XANAX) 0.25 MG tablet; Take 1 tablet (0.25 mg total) by mouth 2 (two) times daily as needed for anxiety.  Dispense: 10 tablet; Refill: 0  2. Need for immunization against influenza - Flu Vaccine QUAD High Dose(Fluad)  3. Current mild episode of major depressive disorder without prior episode (HCC) Doing well on Lexapro - escitalopram (LEXAPRO) 20 MG tablet; Take 1 tablet (20 mg total) by mouth daily.  Dispense: 90 tablet; Refill: 1   Partially dictated using Animal nutritionist. Any errors are unintentional.  Bari Edward, MD [facility]  02/24/2020

## 2020-08-10 ENCOUNTER — Encounter: Payer: Self-pay | Admitting: Internal Medicine

## 2020-08-10 ENCOUNTER — Other Ambulatory Visit: Payer: Self-pay

## 2020-08-10 ENCOUNTER — Ambulatory Visit: Payer: BC Managed Care – PPO | Admitting: Internal Medicine

## 2020-08-10 VITALS — BP 108/76 | HR 75 | Temp 98.1°F | Ht 63.5 in | Wt 152.0 lb

## 2020-08-10 DIAGNOSIS — H1031 Unspecified acute conjunctivitis, right eye: Secondary | ICD-10-CM | POA: Diagnosis not present

## 2020-08-10 DIAGNOSIS — H01131 Eczematous dermatitis of right upper eyelid: Secondary | ICD-10-CM | POA: Diagnosis not present

## 2020-08-10 DIAGNOSIS — F320 Major depressive disorder, single episode, mild: Secondary | ICD-10-CM

## 2020-08-10 MED ORDER — SULFACETAMIDE-PREDNISOLONE 10-0.23 % OP SOLN: 1.0000 [drp] | Freq: Four times a day (QID) | OPHTHALMIC | 0 refills | Status: DC

## 2020-08-10 MED ORDER — TRIAMCINOLONE ACETONIDE 0.025 % EX OINT: 1.0000 "application " | TOPICAL_OINTMENT | Freq: Two times a day (BID) | CUTANEOUS | 0 refills | Status: DC

## 2020-08-10 NOTE — Progress Notes (Signed)
Date:  08/10/2020   Name:  Sherry Gibson   DOB:  20-Dec-1955   MRN:  [id number]   Chief Complaint: Edema (X1 week, Swollen right eyelid, started with blurred vision, was rubbing eye, redness, itching, has went down some with systane eye drops)  Eye Problem  The right eye is affected. This is a new problem. The problem has been unchanged. There was no injury mechanism. The patient is experiencing no pain. There is no known exposure to pink eye. She does not wear contacts. Associated symptoms include an eye discharge, eye redness and itching. Pertinent negatives include no fever. She has tried eye drops for the symptoms. The treatment provided mild relief.  Depression        This is a chronic problem.  The problem has been resolved since onset.  Associated symptoms include no fatigue and no headaches.  Past treatments include SSRIs - Selective serotonin reuptake inhibitors.  Compliance with treatment is good.  Previous treatment provided significant relief.   Lab Results  Component Value Date   CREATININE 0.82 11/21/2019   BUN 12 11/21/2019   NA 139 11/21/2019   K 4.5 11/21/2019   CL 100 11/21/2019   CO2 24 11/21/2019   Lab Results  Component Value Date   CHOL 260 (H) 11/21/2019   HDL 46 11/21/2019   LDLCALC 174 (H) 11/21/2019   TRIG 211 (H) 11/21/2019   CHOLHDL 5.7 (H) 11/21/2019   Lab Results  Component Value Date   TSH 1.390 11/21/2019   No results found for: HGBA1C Lab Results  Component Value Date   WBC 5.9 11/21/2019   HGB 13.3 11/21/2019   HCT 40.1 11/21/2019   MCV 93 11/21/2019   PLT 257 11/21/2019   Lab Results  Component Value Date   ALT 13 11/21/2019   AST 16 11/21/2019   ALKPHOS 95 11/21/2019   BILITOT 0.3 11/21/2019     Review of Systems  Constitutional: Negative for chills, fatigue and fever.  HENT: Negative for trouble swallowing.   Eyes: Positive for discharge, redness, itching and visual disturbance.  Respiratory: Negative for cough and  shortness of breath.   Cardiovascular: Negative for chest pain.  Skin: Positive for rash.  Neurological: Negative for dizziness and headaches.  Psychiatric/Behavioral: Positive for depression.    Patient Active Problem List   Diagnosis Date Noted  . Fear of flying 02/24/2020  . Genital herpes simplex 09/04/2019  . GERD (gastroesophageal reflux disease) 09/04/2019  . Hypercholesterolemia 09/04/2019  . Current mild episode of major depressive disorder without prior episode (HCC) 03/15/2019  . Migraine with aura 10/07/2016  . Osteopenia 12/30/2013  . Personal history of colonic polyps 12/30/2011  . Abnormality of gait 02/16/2011    Allergies  Allergen Reactions  . Clarithromycin Nausea And Vomiting    Past Surgical History:  Procedure Laterality Date  . ABDOMINAL HYSTERECTOMY     had irregular bleeding- still have ovaries    Social History   Tobacco Use  . Smoking status: Never Smoker  . Smokeless tobacco: Never Used  Vaping Use  . Vaping Use: Never used  Substance Use Topics  . Alcohol use: Never  . Drug use: Never     Medication list has been reviewed and updated.  Current Meds  Medication Sig  . escitalopram (LEXAPRO) 20 MG tablet Take 1 tablet (20 mg total) by mouth daily.  . valACYclovir (VALTREX) 1000 MG tablet Take 1 tablet (1,000 mg total) by mouth daily.  . [DISCONTINUED] ALPRAZolam Prudy Feeler)  0.25 MG tablet Take 1 tablet (0.25 mg total) by mouth 2 (two) times daily as needed for anxiety.    PHQ 2/9 Scores 08/10/2020 02/24/2020 11/21/2019 09/04/2019  PHQ - 2 Score 0 0 0 0  PHQ- 9 Score 0 0 0 0    GAD 7 : Generalized Anxiety Score 08/10/2020 02/24/2020 09/04/2019  Nervous, Anxious, on Edge 0 0 0  Control/stop worrying 0 0 0  Worry too much - different things 0 0 0  Trouble relaxing 0 0 0  Restless 0 0 0  Easily annoyed or irritable 0 0 0  Afraid - awful might happen 0 0 0  Total GAD 7 Score 0 0 0  Anxiety Difficulty Not difficult at all Not difficult at  all Not difficult at all    BP Readings from Last 3 Encounters:  08/10/20 108/76  02/24/20 126/62  11/21/19 120/78    Physical Exam Vitals and nursing note reviewed.  Constitutional:      General: She is not in acute distress.    Appearance: Normal appearance. She is well-developed.  HENT:     Head: Normocephalic and atraumatic.  Eyes:     General:        Right eye: Discharge present.        Left eye: No discharge.     Extraocular Movements: Extraocular movements intact.     Conjunctiva/sclera:     Right eye: Right conjunctiva is injected. Chemosis present.     Comments: Right upper eye lid red macular rash   Pulmonary:     Effort: Pulmonary effort is normal. No respiratory distress.  Skin:    General: Skin is warm and dry.     Findings: Rash present.  Neurological:     Mental Status: She is alert and oriented to person, place, and time.  Psychiatric:        Mood and Affect: Mood normal.        Behavior: Behavior normal.     Wt Readings from Last 3 Encounters:  08/10/20 152 lb (68.9 kg)  02/24/20 153 lb (69.4 kg)  11/21/19 153 lb (69.4 kg)    BP 108/76   Pulse 75   Temp 98.1 F (36.7 C) (Oral)   Ht 5' 3.5" (1.613 m)   Wt 152 lb (68.9 kg)   SpO2 96%   BMI 26.50 kg/m   Assessment and Plan: 1. Acute conjunctivitis of right eye, unspecified acute conjunctivitis type Warm compresses; use in left eye if needed - sulfacetamide-prednisoLONE (VASOCIDIN) 10-0.23 % ophthalmic solution; Place 1 drop into the right eye every 6 (six) hours.  Dispense: 5 mL; Refill: 0  2. Eczematous dermatitis of right upper eyelid Avoid getting ointment in eye; may use vaseline PRN comfort - triamcinolone (KENALOG) 0.025 % ointment; Apply 1 application topically 2 (two) times daily. To upper eye lid  Dispense: 30 g; Refill: 0  3. Current mild episode of major depressive disorder without prior episode (HCC) Clinically stable on current regimen with good control of symptoms, No SI or  HI. Will continue current therapy.   Partially dictated using Animal nutritionist. Any errors are unintentional.  Bari Edward, MD [facility]  08/10/2020

## 2020-08-11 ENCOUNTER — Telehealth: Payer: Self-pay | Admitting: Internal Medicine

## 2020-08-11 ENCOUNTER — Other Ambulatory Visit: Payer: Self-pay | Admitting: Internal Medicine

## 2020-08-11 DIAGNOSIS — H1031 Unspecified acute conjunctivitis, right eye: Secondary | ICD-10-CM

## 2020-08-11 MED ORDER — NEOMYCIN-POLYMYXIN-DEXAMETH 3.5-10000-0.1 OP SUSP: 2.0000 [drp] | Freq: Four times a day (QID) | OPHTHALMIC | 0 refills | Status: AC

## 2020-08-11 NOTE — Telephone Encounter (Signed)
Marylu Lund from [place] [facility] pharmacy called and stated that they were not able to find the drug sulfacetamide-prednisoLONE (VASOCIDIN) 10-0.23 % ophthalmic solution They also checked with [facility] and they said the same / please advise

## 2020-08-11 NOTE — Telephone Encounter (Signed)
Sent another Rx to [facility].

## 2020-09-13 ENCOUNTER — Other Ambulatory Visit: Payer: Self-pay | Admitting: Internal Medicine

## 2020-09-13 DIAGNOSIS — F320 Major depressive disorder, single episode, mild: Secondary | ICD-10-CM

## 2020-09-13 NOTE — Telephone Encounter (Signed)
Requested Prescriptions  Pending Prescriptions Disp Refills  . escitalopram (LEXAPRO) 20 MG tablet [Pharmacy Med Name: Escitalopram Oxalate 20 MG Oral Tablet] 90 tablet 0    Sig: Take 1 tablet by mouth once daily     Psychiatry:  Antidepressants - SSRI Passed - 09/13/2020  9:27 AM      Passed - Completed PHQ-2 or PHQ-9 in the last 360 days      Passed - Valid encounter within last 6 months    Recent Outpatient Visits          1 month ago Acute conjunctivitis of right eye, unspecified acute conjunctivitis type   [facility] Reubin Milan, MD   6 months ago Fear of flying   [facility] Reubin Milan, MD   9 months ago Annual physical exam   [facility] Reubin Milan, MD   1 year ago Muscle spasms of neck   [facility] Reubin Milan, MD   1 year ago Current mild episode of major depressive disorder without prior episode [facility])   [facility] Reubin Milan, MD

## 2020-09-29 ENCOUNTER — Ambulatory Visit: Payer: BC Managed Care – PPO | Admitting: Internal Medicine

## 2020-09-29 ENCOUNTER — Other Ambulatory Visit: Payer: Self-pay

## 2020-09-29 ENCOUNTER — Encounter: Payer: Self-pay | Admitting: Internal Medicine

## 2020-09-29 VITALS — BP 134/80 | HR 88 | Temp 97.8°F | Ht 63.5 in | Wt 150.0 lb

## 2020-09-29 DIAGNOSIS — J0100 Acute maxillary sinusitis, unspecified: Secondary | ICD-10-CM | POA: Diagnosis not present

## 2020-09-29 MED ORDER — AMOXICILLIN-POT CLAVULANATE 875-125 MG PO TABS: 1.0000 | ORAL_TABLET | Freq: Two times a day (BID) | ORAL | 0 refills | Status: AC

## 2020-09-29 NOTE — Progress Notes (Signed)
Date:  09/29/2020   Name:  Sherry Gibson   DOB:  1955/09/19   MRN:  [id number]   Chief Complaint: Sore Throat (Sore throat and ear pain. No fever. Started last Wednesday  with painful tooth on lower right side of mouth. After that the pain moved into her right ear, throat. Now has cough with drainage. No fever. No SOB. No color to mucous. Home test for Covid on Sunday was NEG.)  Sinusitis This is a new problem. The current episode started in the past 7 days. There has been no fever. Associated symptoms include coughing, ear pain and a sore throat. Pertinent negatives include no chills.   Lab Results  Component Value Date   CREATININE 0.82 11/21/2019   BUN 12 11/21/2019   NA 139 11/21/2019   K 4.5 11/21/2019   CL 100 11/21/2019   CO2 24 11/21/2019   Lab Results  Component Value Date   CHOL 260 (H) 11/21/2019   HDL 46 11/21/2019   LDLCALC 174 (H) 11/21/2019   TRIG 211 (H) 11/21/2019   CHOLHDL 5.7 (H) 11/21/2019   Lab Results  Component Value Date   TSH 1.390 11/21/2019   No results found for: HGBA1C Lab Results  Component Value Date   WBC 5.9 11/21/2019   HGB 13.3 11/21/2019   HCT 40.1 11/21/2019   MCV 93 11/21/2019   PLT 257 11/21/2019   Lab Results  Component Value Date   ALT 13 11/21/2019   AST 16 11/21/2019   ALKPHOS 95 11/21/2019   BILITOT 0.3 11/21/2019     Review of Systems  Constitutional:  Negative for chills, fatigue and fever.  HENT:  Positive for ear pain, postnasal drip and sore throat.   Respiratory:  Positive for cough.   Cardiovascular:  Negative for chest pain and palpitations.   Patient Active Problem List   Diagnosis Date Noted   Fear of flying 02/24/2020   Genital herpes simplex 09/04/2019   GERD (gastroesophageal reflux disease) 09/04/2019   Hypercholesterolemia 09/04/2019   Current mild episode of major depressive disorder without prior episode (HCC) 03/15/2019   Migraine with aura 10/07/2016   Osteopenia 12/30/2013   Personal  history of colonic polyps 12/30/2011   Abnormality of gait 02/16/2011    Allergies  Allergen Reactions   Clarithromycin Nausea And Vomiting    Past Surgical History:  Procedure Laterality Date   ABDOMINAL HYSTERECTOMY     had irregular bleeding- still have ovaries    Social History   Tobacco Use   Smoking status: Never   Smokeless tobacco: Never  Vaping Use   Vaping Use: Never used  Substance Use Topics   Alcohol use: Never   Drug use: Never     Medication list has been reviewed and updated.  Current Meds  Medication Sig   amoxicillin-clavulanate (AUGMENTIN) 875-125 MG tablet Take 1 tablet by mouth 2 (two) times daily for 10 days.   escitalopram (LEXAPRO) 20 MG tablet Take 1 tablet by mouth once daily   valACYclovir (VALTREX) 1000 MG tablet Take 1 tablet (1,000 mg total) by mouth daily.   [DISCONTINUED] triamcinolone (KENALOG) 0.025 % ointment Apply 1 application topically 2 (two) times daily. To upper eye lid    PHQ 2/9 Scores 09/29/2020 08/10/2020 02/24/2020 11/21/2019  PHQ - 2 Score 0 0 0 0  PHQ- 9 Score 0 0 0 0    GAD 7 : Generalized Anxiety Score 09/29/2020 08/10/2020 02/24/2020 09/04/2019  Nervous, Anxious, on Edge 0 0 0 0  Control/stop worrying 0 0 0 0  Worry too much - different things 1 0 0 0  Trouble relaxing 0 0 0 0  Restless 0 0 0 0  Easily annoyed or irritable 0 0 0 0  Afraid - awful might happen 0 0 0 0  Total GAD 7 Score 1 0 0 0  Anxiety Difficulty - Not difficult at all Not difficult at all Not difficult at all    BP Readings from Last 3 Encounters:  09/29/20 134/80  08/10/20 108/76  02/24/20 126/62    Physical Exam Constitutional:      Appearance: She is well-developed.  HENT:     Right Ear: Ear canal and external ear normal. Tympanic membrane is retracted. Tympanic membrane is not erythematous.     Left Ear: Ear canal and external ear normal. Tympanic membrane is retracted. Tympanic membrane is not erythematous.     Nose:     Right Sinus:  Maxillary sinus tenderness present. No frontal sinus tenderness.     Left Sinus: Maxillary sinus tenderness present. No frontal sinus tenderness.     Mouth/Throat:     Mouth: No oral lesions.     Pharynx: Uvula midline. Posterior oropharyngeal erythema present. No oropharyngeal exudate.  Cardiovascular:     Rate and Rhythm: Normal rate and regular rhythm.     Heart sounds: Normal heart sounds.  Pulmonary:     Breath sounds: Normal breath sounds. No wheezing or rales.  Lymphadenopathy:     Cervical: Cervical adenopathy present.     Right cervical: Superficial cervical adenopathy present.     Left cervical: Superficial cervical adenopathy present.  Neurological:     Mental Status: She is alert and oriented to person, place, and time.    Wt Readings from Last 3 Encounters:  09/29/20 150 lb (68 kg)  08/10/20 152 lb (68.9 kg)  02/24/20 153 lb (69.4 kg)    BP 134/80   Pulse 88   Temp 97.8 F (36.6 C) (Oral)   Ht 5' 3.5" (1.613 m)   Wt 150 lb (68 kg)   SpO2 96%   BMI 26.15 kg/m   Assessment and Plan: 1. Acute non-recurrent maxillary sinusitis Continue Tylenol as needed Take over the counter Claritin for PND Follow up with Dentist if tooth pain recurs - amoxicillin-clavulanate (AUGMENTIN) 875-125 MG tablet; Take 1 tablet by mouth 2 (two) times daily for 10 days.  Dispense: 20 tablet; Refill: 0   Partially dictated using Animal nutritionist. Any errors are unintentional.  Bari Edward, MD [facility]  09/29/2020

## 2020-10-07 ENCOUNTER — Other Ambulatory Visit: Payer: Self-pay

## 2020-10-07 ENCOUNTER — Other Ambulatory Visit: Payer: Self-pay | Admitting: Internal Medicine

## 2020-10-07 DIAGNOSIS — A6000 Herpesviral infection of urogenital system, unspecified: Secondary | ICD-10-CM

## 2020-10-07 NOTE — Telephone Encounter (Signed)
   Notes to clinic:  this script has expired  Review for continued use and refill    Requested Prescriptions  Pending Prescriptions Disp Refills   valACYclovir (VALTREX) 1000 MG tablet [Pharmacy Med Name: valACYclovir HCl 1 GM Oral Tablet] 90 tablet 0    Sig: Take 1 tablet by mouth once daily      Antimicrobials:  Antiviral Agents - Anti-Herpetic Passed - 10/07/2020 10:35 AM      Passed - Valid encounter within last 12 months    Recent Outpatient Visits           1 week ago Acute non-recurrent maxillary sinusitis   [facility] Reubin Milan, [facility]   1 month ago Acute conjunctivitis of right eye, unspecified acute conjunctivitis type   [facility] Reubin Milan, MD   7 months ago Fear of flying   [facility] Reubin Milan, MD   10 months ago Annual physical exam   [facility] Reubin Milan, MD   1 year ago Muscle spasms of neck   [facility] Reubin Milan, MD

## 2020-12-08 ENCOUNTER — Other Ambulatory Visit: Payer: Self-pay | Admitting: Internal Medicine

## 2020-12-08 DIAGNOSIS — F320 Major depressive disorder, single episode, mild: Secondary | ICD-10-CM

## 2020-12-31 ENCOUNTER — Encounter: Payer: Self-pay | Admitting: Internal Medicine

## 2021-01-01 ENCOUNTER — Telehealth: Payer: BC Managed Care – PPO | Admitting: Internal Medicine

## 2021-01-01 ENCOUNTER — Telehealth: Payer: Self-pay

## 2021-01-01 ENCOUNTER — Encounter: Payer: Self-pay | Admitting: Internal Medicine

## 2021-01-01 DIAGNOSIS — U071 COVID-19: Secondary | ICD-10-CM | POA: Diagnosis not present

## 2021-01-01 MED ORDER — MOLNUPIRAVIR EUA 200MG CAPSULE: 4.0000 | ORAL_CAPSULE | Freq: Two times a day (BID) | ORAL | 0 refills | Status: AC

## 2021-01-01 NOTE — Progress Notes (Signed)
Date:  01/01/2021   Name:  Sherry Gibson   DOB:  03/24/1956   MRN:  [id number]  This encounter was conducted via video encounter due to the need for social distancing in light of the Covid-19 pandemic.  The patient was correctly identified.  I advised that I am conducting the visit from a secure room in my office at [facility] clinic.  The patient is located at home. The limitations of this form of encounter were discussed with the patient and he/she agreed to proceed.  Some vital signs will be absent.  Chief Complaint: Covid Positive (Tested positive yesterday 9/22, sore throat, stuffy head, fever, dry cough ) Symptoms started last weekend. Sore throat, headache and sinus infection sx. First test was negative.  Mother is here as well - she became ill on Tuesday and was admitted to [facility] with covid pneumonia.  Cough This is a new problem. The current episode started yesterday. The problem occurs every few minutes. The cough is Non-productive. Associated symptoms include chills, ear pain and a sore throat. Pertinent negatives include no fever, shortness of breath or wheezing. Risk factors: tested positive for Covid. She has tried OTC cough suppressant for the symptoms.   Lab Results  Component Value Date   CREATININE 0.82 11/21/2019   BUN 12 11/21/2019   NA 139 11/21/2019   K 4.5 11/21/2019   CL 100 11/21/2019   CO2 24 11/21/2019   Lab Results  Component Value Date   CHOL 260 (H) 11/21/2019   HDL 46 11/21/2019   LDLCALC 174 (H) 11/21/2019   TRIG 211 (H) 11/21/2019   CHOLHDL 5.7 (H) 11/21/2019   Lab Results  Component Value Date   TSH 1.390 11/21/2019   No results found for: HGBA1C Lab Results  Component Value Date   WBC 5.9 11/21/2019   HGB 13.3 11/21/2019   HCT 40.1 11/21/2019   MCV 93 11/21/2019   PLT 257 11/21/2019   Lab Results  Component Value Date   ALT 13 11/21/2019   AST 16 11/21/2019   ALKPHOS 95 11/21/2019   BILITOT 0.3 11/21/2019     Review of  Systems  Constitutional:  Positive for chills. Negative for fever.  HENT:  Positive for ear pain and sore throat.   Respiratory:  Positive for cough. Negative for shortness of breath and wheezing.    Patient Active Problem List   Diagnosis Date Noted   Fear of flying 02/24/2020   Genital herpes simplex 09/04/2019   GERD (gastroesophageal reflux disease) 09/04/2019   Hypercholesterolemia 09/04/2019   Current mild episode of major depressive disorder without prior episode (HCC) 03/15/2019   Migraine with aura 10/07/2016   Osteopenia 12/30/2013   Personal history of colonic polyps 12/30/2011   Abnormality of gait 02/16/2011    Allergies  Allergen Reactions   Clarithromycin Nausea And Vomiting    Past Surgical History:  Procedure Laterality Date   ABDOMINAL HYSTERECTOMY     had irregular bleeding- still have ovaries    Social History   Tobacco Use   Smoking status: Never   Smokeless tobacco: Never  Vaping Use   Vaping Use: Never used  Substance Use Topics   Alcohol use: Never   Drug use: Never     Medication list has been reviewed and updated.  Current Meds  Medication Sig   escitalopram (LEXAPRO) 20 MG tablet Take 1 tablet by mouth once daily   valACYclovir (VALTREX) 1000 MG tablet Take 1 tablet by mouth once daily (  Patient taking differently: as needed.)    PHQ 2/9 Scores 01/01/2021 09/29/2020 08/10/2020 02/24/2020  PHQ - 2 Score 0 0 0 0  PHQ- 9 Score 0 0 0 0    GAD 7 : Generalized Anxiety Score 01/01/2021 09/29/2020 08/10/2020 02/24/2020  Nervous, Anxious, on Edge 0 0 0 0  Control/stop worrying 0 0 0 0  Worry too much - different things 0 1 0 0  Trouble relaxing 0 0 0 0  Restless 0 0 0 0  Easily annoyed or irritable 0 0 0 0  Afraid - awful might happen 0 0 0 0  Total GAD 7 Score 0 1 0 0  Anxiety Difficulty - - Not difficult at all Not difficult at all    BP Readings from Last 3 Encounters:  09/29/20 134/80  08/10/20 108/76  02/24/20 126/62    Physical  Exam Constitutional:      Appearance: Normal appearance. She is not ill-appearing.  Pulmonary:     Effort: Pulmonary effort is normal.     Comments: No cough noted, no wheezing Neurological:     Mental Status: She is alert.  Psychiatric:        Attention and Perception: Attention normal.        Mood and Affect: Mood normal.    Wt Readings from Last 3 Encounters:  09/29/20 150 lb (68 kg)  08/10/20 152 lb (68.9 kg)  02/24/20 153 lb (69.4 kg)    There were no vitals taken for this visit.  Assessment and Plan: 1. COVID-19 virus infection Prolonged symptoms course that only tested positive yesterday.  She feels a bit improved but she will be caring for her elderly mother in a few days and will need to recover as soon as possible. Will prescribe antivirals; precautions given. Home care education provided. - molnupiravir EUA (LAGEVRIO) 200 mg CAPS capsule; Take 4 capsules (800 mg total) by mouth 2 (two) times daily for 5 days.  Dispense: 40 capsule; Refill: 0  I spent 7 minutes on this encounter. Partially dictated using Animal nutritionist. Any errors are unintentional.  Bari Edward, MD [facility]  01/01/2021

## 2021-01-01 NOTE — Telephone Encounter (Signed)

## 2021-03-03 ENCOUNTER — Other Ambulatory Visit: Payer: Self-pay

## 2021-03-03 ENCOUNTER — Ambulatory Visit: Payer: Medicare PPO | Admitting: Internal Medicine

## 2021-03-03 ENCOUNTER — Encounter: Payer: Self-pay | Admitting: Internal Medicine

## 2021-03-03 VITALS — BP 108/70 | HR 73 | Ht 63.5 in | Wt 150.6 lb

## 2021-03-03 DIAGNOSIS — K219 Gastro-esophageal reflux disease without esophagitis: Secondary | ICD-10-CM

## 2021-03-03 MED ORDER — PANTOPRAZOLE SODIUM 40 MG PO TBEC: 40.0000 mg | DELAYED_RELEASE_TABLET | Freq: Every day | ORAL | 0 refills | Status: DC

## 2021-03-03 NOTE — Progress Notes (Signed)
Date:  03/03/2021   Name:  Sherry Gibson   DOB:  [birth year]   MRN:  [id number]   Chief Complaint: Gastroesophageal Reflux  Gastroesophageal Reflux She complains of belching and heartburn. She reports no abdominal pain, no chest pain, no choking, no nausea or no wheezing. This is a recurrent problem. The current episode started more than 1 month ago. The problem occurs constantly. The problem has been gradually worsening. The heartburn duration is more than one hour. The heartburn is located in the substernum. The heartburn is of moderate intensity. The heartburn wakes her from sleep. The heartburn limits her activity. The heartburn changes with position. The symptoms are aggravated by certain foods, medications and lying down. Pertinent negatives include no fatigue. She has tried an antacid (and pepcid complete temporary relief) for the symptoms. The treatment provided mild relief.   Lab Results  Component Value Date   CREATININE 0.82 11/21/2019   BUN 12 11/21/2019   NA 139 11/21/2019   K 4.5 11/21/2019   CL 100 11/21/2019   CO2 24 11/21/2019   Lab Results  Component Value Date   CHOL 260 (H) 11/21/2019   HDL 46 11/21/2019   LDLCALC 174 (H) 11/21/2019   TRIG 211 (H) 11/21/2019   CHOLHDL 5.7 (H) 11/21/2019   Lab Results  Component Value Date   TSH 1.390 11/21/2019   No results found for: HGBA1C Lab Results  Component Value Date   WBC 5.9 11/21/2019   HGB 13.3 11/21/2019   HCT 40.1 11/21/2019   MCV 93 11/21/2019   PLT 257 11/21/2019   Lab Results  Component Value Date   ALT 13 11/21/2019   AST 16 11/21/2019   ALKPHOS 95 11/21/2019   BILITOT 0.3 11/21/2019   No components found for: VITD  Review of Systems  Constitutional:  Negative for chills, fatigue, fever and unexpected weight change.  Respiratory:  Negative for choking, chest tightness, shortness of breath and wheezing.   Cardiovascular:  Negative for chest pain and palpitations.  Gastrointestinal:   Positive for heartburn. Negative for abdominal pain, constipation, diarrhea, nausea and vomiting.   Patient Active Problem List   Diagnosis Date Noted   Fear of flying 02/24/2020   Genital herpes simplex 09/04/2019   GERD (gastroesophageal reflux disease) 09/04/2019   Hypercholesterolemia 09/04/2019   Current mild episode of major depressive disorder without prior episode (HCC) 03/15/2019   Migraine with aura 10/07/2016   Osteopenia 12/30/2013   Personal history of colonic polyps 12/30/2011   Abnormality of gait 02/16/2011    Allergies  Allergen Reactions   Clarithromycin Nausea And Vomiting    Past Surgical History:  Procedure Laterality Date   ABDOMINAL HYSTERECTOMY     had irregular bleeding- still have ovaries    Social History   Tobacco Use   Smoking status: Never   Smokeless tobacco: Never  Vaping Use   Vaping Use: Never used  Substance Use Topics   Alcohol use: Never   Drug use: Never     Medication list has been reviewed and updated.  Current Meds  Medication Sig   escitalopram (LEXAPRO) 20 MG tablet Take 1 tablet by mouth once daily   valACYclovir (VALTREX) 1000 MG tablet Take 1 tablet by mouth once daily (Patient taking differently: as needed.)    PHQ 2/9 Scores 01/01/2021 09/29/2020 08/10/2020 02/24/2020  PHQ - 2 Score 0 0 0 0  PHQ- 9 Score 0 0 0 0    GAD 7 : Generalized Anxiety Score 01/01/2021  09/29/2020 08/10/2020 02/24/2020  Nervous, Anxious, on Edge 0 0 0 0  Control/stop worrying 0 0 0 0  Worry too much - different things 0 1 0 0  Trouble relaxing 0 0 0 0  Restless 0 0 0 0  Easily annoyed or irritable 0 0 0 0  Afraid - awful might happen 0 0 0 0  Total GAD 7 Score 0 1 0 0  Anxiety Difficulty - - Not difficult at all Not difficult at all    BP Readings from Last 3 Encounters:  03/03/21 108/70  09/29/20 134/80  08/10/20 108/76    Physical Exam Vitals and nursing note reviewed.  Constitutional:      General: She is not in acute distress.     Appearance: Normal appearance. She is well-developed.  HENT:     Head: Normocephalic and atraumatic.  Cardiovascular:     Rate and Rhythm: Normal rate and regular rhythm.     Pulses: Normal pulses.  Pulmonary:     Effort: Pulmonary effort is normal. No respiratory distress.     Breath sounds: No wheezing or rhonchi.  Abdominal:     General: Abdomen is flat. Bowel sounds are normal.     Palpations: Abdomen is soft.     Tenderness: There is no abdominal tenderness.  Musculoskeletal:     Cervical back: Normal range of motion.  Skin:    General: Skin is warm and dry.     Findings: No rash.  Neurological:     Mental Status: She is alert and oriented to person, place, and time.  Psychiatric:        Mood and Affect: Mood normal.        Behavior: Behavior normal.    Wt Readings from Last 3 Encounters:  03/03/21 150 lb 9.6 oz (68.3 kg)  09/29/20 150 lb (68 kg)  08/10/20 152 lb (68.9 kg)    BP 108/70   Pulse 73   Ht 5' 3.5" (1.613 m)   Wt 150 lb 9.6 oz (68.3 kg)   SpO2 99%   BMI 26.26 kg/m   Assessment and Plan: 1. Gastroesophageal reflux disease, unspecified whether esophagitis present Hx of gerd treated with omeprazole in the past; no hx of ulcer, HH or H Pylori. Not responsive to antacids or Pepcid Begin PPI daily - follow up if no improvement - pantoprazole (PROTONIX) 40 MG tablet; Take 1 tablet (40 mg total) by mouth daily.  Dispense: 90 tablet; Refill: 0   Partially dictated using Animal nutritionist. Any errors are unintentional.  Bari Edward, MD [facility]  03/03/2021

## 2021-03-18 ENCOUNTER — Other Ambulatory Visit: Payer: Self-pay | Admitting: Internal Medicine

## 2021-03-18 DIAGNOSIS — F320 Major depressive disorder, single episode, mild: Secondary | ICD-10-CM

## 2021-03-18 NOTE — Telephone Encounter (Signed)
Requested Prescriptions  Pending Prescriptions Disp Refills  . escitalopram (LEXAPRO) 20 MG tablet [Pharmacy Med Name: Escitalopram Oxalate 20 MG Oral Tablet] 90 tablet 0    Sig: Take 1 tablet by mouth once daily     Psychiatry:  Antidepressants - SSRI Passed - 03/18/2021  8:40 AM      Passed - Completed PHQ-2 or PHQ-9 in the last 360 days      Passed - Valid encounter within last 6 months    Recent Outpatient Visits          2 weeks ago Gastroesophageal reflux disease, unspecified whether esophagitis present   [facility] Reubin Milan, MD   2 months ago COVID-19 virus infection   [facility] Reubin Milan, MD   5 months ago Acute non-recurrent maxillary sinusitis   [facility] Reubin Milan, MD   7 months ago Acute conjunctivitis of right eye, unspecified acute conjunctivitis type   [facility] Reubin Milan, MD   1 year ago Fear of flying   [facility] Reubin Milan, MD      Future Appointments            In 1 month Judithann Graves, Nyoka Cowden, MD [facility]

## 2021-03-19 DIAGNOSIS — Z1231 Encounter for screening mammogram for malignant neoplasm of breast: Secondary | ICD-10-CM | POA: Diagnosis not present

## 2021-04-28 ENCOUNTER — Encounter: Payer: Self-pay | Admitting: Internal Medicine

## 2021-04-28 ENCOUNTER — Ambulatory Visit (INDEPENDENT_AMBULATORY_CARE_PROVIDER_SITE_OTHER): Payer: Medicare PPO | Admitting: Internal Medicine

## 2021-04-28 ENCOUNTER — Other Ambulatory Visit: Payer: Self-pay

## 2021-04-28 VITALS — BP 118/68 | HR 68 | Ht 63.5 in | Wt 150.4 lb

## 2021-04-28 DIAGNOSIS — E7800 Pure hypercholesterolemia, unspecified: Secondary | ICD-10-CM

## 2021-04-28 DIAGNOSIS — K219 Gastro-esophageal reflux disease without esophagitis: Secondary | ICD-10-CM | POA: Diagnosis not present

## 2021-04-28 DIAGNOSIS — F320 Major depressive disorder, single episode, mild: Secondary | ICD-10-CM

## 2021-04-28 DIAGNOSIS — Z23 Encounter for immunization: Secondary | ICD-10-CM | POA: Diagnosis not present

## 2021-04-28 DIAGNOSIS — Z0000 Encounter for general adult medical examination without abnormal findings: Secondary | ICD-10-CM

## 2021-04-28 NOTE — Progress Notes (Signed)
Date:  04/28/2021   Name:  Sherry Gibson   DOB:  1955/07/11   MRN:  [id number]   Chief Complaint: Annual Exam (Breast Exam. No pap.) Sherry Gibson is a 66 y.o. female who presents today for her Complete Annual Exam. She feels well. She reports exercising - some. She reports she is sleeping fairly well. Breast complaints - none.  Mammogram: 03/2021 DEXA: 01/2020 osteopenia Pap smear: discontinued Colonoscopy: 11/2014 repeat 10 yrs  Immunization History  Administered Date(s) Administered   Fluad Quad(high Dose 65+) 02/24/2020   Influenza Inj Mdck Quad Pf 02/19/2017   Influenza, High Dose Seasonal PF 01/17/2019   Influenza-Unspecified 01/22/2013, 01/22/2014, 01/20/2021   PFIZER(Purple Top)SARS-COV-2 Vaccination 05/05/2019, 05/26/2019, 02/03/2020   Pneumococcal Conjugate-13 11/21/2019   Tdap 06/06/2008   Zoster Recombinat (Shingrix) 10/24/2018, 01/12/2019   Zoster, Live 08/28/2014    Gastroesophageal Reflux She complains of heartburn. She reports no abdominal pain, no chest pain, no coughing or no wheezing. This is a recurrent problem. The problem occurs rarely. The problem has been gradually improving. Pertinent negatives include no fatigue. She has tried a PPI for the symptoms. The treatment provided moderate relief.  Depression        This is a chronic problem.The problem is unchanged.  Associated symptoms include decreased concentration.  Associated symptoms include no fatigue and no headaches.  Past treatments include SSRIs - Selective serotonin reuptake inhibitors. Hyperlipidemia This is a chronic problem. The problem is uncontrolled. Pertinent negatives include no chest pain or shortness of breath. Current antihyperlipidemic treatment includes diet change. 10 yr risk is 6%.  Lab Results  Component Value Date   NA 139 11/21/2019   K 4.5 11/21/2019   CO2 24 11/21/2019   GLUCOSE 82 11/21/2019   BUN 12 11/21/2019   CREATININE 0.82 11/21/2019   CALCIUM 9.2 11/21/2019    GFRNONAA 75 11/21/2019   Lab Results  Component Value Date   CHOL 260 (H) 11/21/2019   HDL 46 11/21/2019   LDLCALC 174 (H) 11/21/2019   TRIG 211 (H) 11/21/2019   CHOLHDL 5.7 (H) 11/21/2019   Lab Results  Component Value Date   TSH 1.390 11/21/2019   No results found for: HGBA1C Lab Results  Component Value Date   WBC 5.9 11/21/2019   HGB 13.3 11/21/2019   HCT 40.1 11/21/2019   MCV 93 11/21/2019   PLT 257 11/21/2019   Lab Results  Component Value Date   ALT 13 11/21/2019   AST 16 11/21/2019   ALKPHOS 95 11/21/2019   BILITOT 0.3 11/21/2019   No results found for: 25OHVITD2, 25OHVITD3, VD25OH   Review of Systems  Constitutional:  Negative for chills, fatigue and fever.  HENT:  Negative for congestion, hearing loss, tinnitus, trouble swallowing and voice change.   Eyes:  Negative for visual disturbance.  Respiratory:  Negative for cough, chest tightness, shortness of breath and wheezing.   Cardiovascular:  Negative for chest pain, palpitations and leg swelling.  Gastrointestinal:  Positive for heartburn. Negative for abdominal pain, constipation, diarrhea and vomiting.  Endocrine: Negative for polydipsia and polyuria.  Genitourinary:  Negative for dysuria, frequency, genital sores, vaginal bleeding and vaginal discharge.  Musculoskeletal:  Negative for arthralgias, gait problem and joint swelling.  Skin:  Negative for color change and rash.  Neurological:  Negative for dizziness, tremors, light-headedness and headaches.  Hematological:  Negative for adenopathy. Does not bruise/bleed easily.  Psychiatric/Behavioral:  Positive for decreased concentration and depression. Negative for dysphoric mood and sleep disturbance. The patient is  not nervous/anxious.    Patient Active Problem List   Diagnosis Date Noted   Fear of flying 02/24/2020   Genital herpes simplex 09/04/2019   GERD (gastroesophageal reflux disease) 09/04/2019   Hypercholesterolemia 09/04/2019   Current  mild episode of major depressive disorder without prior episode ([place]) 03/15/2019   Migraine with aura 10/07/2016   Osteopenia 12/30/2013   Personal history of colonic polyps 12/30/2011   Abnormality of gait 02/16/2011    Allergies  Allergen Reactions   Clarithromycin Nausea And Vomiting    Past Surgical History:  Procedure Laterality Date   ABDOMINAL HYSTERECTOMY     had irregular bleeding- still have ovaries    Social History   Tobacco Use   Smoking status: Never   Smokeless tobacco: Never  Vaping Use   Vaping Use: Never used  Substance Use Topics   Alcohol use: Never   Drug use: Never     Medication list has been reviewed and updated.  Current Meds  Medication Sig   escitalopram (LEXAPRO) 20 MG tablet Take 1 tablet by mouth once daily   pantoprazole (PROTONIX) 40 MG tablet Take 1 tablet (40 mg total) by mouth daily.   valACYclovir (VALTREX) 1000 MG tablet Take 1 tablet by mouth once daily (Patient taking differently: as needed.)    PHQ 2/9 Scores 04/28/2021 01/01/2021 09/29/2020 08/10/2020  PHQ - 2 Score 0 0 0 0  PHQ- 9 Score 0 0 0 0    GAD 7 : Generalized Anxiety Score 04/28/2021 01/01/2021 09/29/2020 08/10/2020  Nervous, Anxious, on Edge 0 0 0 0  Control/stop worrying 0 0 0 0  Worry too much - different things 0 0 1 0  Trouble relaxing 0 0 0 0  Restless 0 0 0 0  Easily annoyed or irritable 0 0 0 0  Afraid - awful might happen 0 0 0 0  Total GAD 7 Score 0 0 1 0  Anxiety Difficulty Not difficult at all - - Not difficult at all    BP Readings from Last 3 Encounters:  04/28/21 118/68  03/03/21 108/70  09/29/20 134/80    Physical Exam Vitals and nursing note reviewed.  Constitutional:      General: She is not in acute distress.    Appearance: She is well-developed.  HENT:     Head: Normocephalic and atraumatic.     Right Ear: Tympanic membrane and ear canal normal.     Left Ear: Tympanic membrane and ear canal normal.     Nose:     Right Sinus: No  maxillary sinus tenderness.     Left Sinus: No maxillary sinus tenderness.  Eyes:     General: No scleral icterus.       Right eye: No discharge.        Left eye: No discharge.     Conjunctiva/sclera: Conjunctivae normal.  Neck:     Thyroid: No thyromegaly.     Vascular: No carotid bruit.  Cardiovascular:     Rate and Rhythm: Normal rate and regular rhythm.     Pulses: Normal pulses.     Heart sounds: Normal heart sounds.  Pulmonary:     Effort: Pulmonary effort is normal. No respiratory distress.     Breath sounds: No wheezing.  Chest:  Breasts:    Right: No mass, nipple discharge, skin change or tenderness.     Left: No mass, nipple discharge, skin change or tenderness.  Abdominal:     General: Bowel sounds are normal.  Palpations: Abdomen is soft.     Tenderness: There is no abdominal tenderness.  Musculoskeletal:     Cervical back: Normal range of motion. No erythema.     Right lower leg: No edema.     Left lower leg: No edema.  Lymphadenopathy:     Cervical: No cervical adenopathy.  Skin:    General: Skin is warm and dry.     Capillary Refill: Capillary refill takes less than 2 seconds.     Findings: No rash.  Neurological:     General: No focal deficit present.     Mental Status: She is alert and oriented to person, place, and time.     Cranial Nerves: No cranial nerve deficit.     Sensory: No sensory deficit.     Deep Tendon Reflexes: Reflexes are normal and symmetric.  Psychiatric:        Attention and Perception: Attention normal.        Mood and Affect: Mood normal.    Wt Readings from Last 3 Encounters:  04/28/21 150 lb 6.4 oz (68.2 kg)  03/03/21 150 lb 9.6 oz (68.3 kg)  09/29/20 150 lb (68 kg)    BP 118/68    Pulse 68    Ht 5' 3.5" (1.613 m)    Wt 150 lb 6.4 oz (68.2 kg)    SpO2 95%    BMI 26.22 kg/m   Assessment and Plan: 1. Annual physical exam Normal exam.  Encourage more regular exercise. Up to date on screenings and immunizations. Would  recommend Tdap update. - Comprehensive metabolic panel  2. Gastroesophageal reflux disease, unspecified whether esophagitis present Symptoms improved on daily PPI No red flag signs such as weight loss, n/v, melena Will continue current therapy. - CBC with Differential/Platelet  3. Current mild episode of major depressive disorder without prior episode ([place]) Clinically stable on current regimen with good control of symptoms, No SI or HI. Will continue current therapy. She does complain of some mild word finding issues but no memory issues.  Recommend brain exercises regularly. - TSH  4. Hypercholesterolemia Check labs and advise - Lipid panel  5. Need for vaccination for pneumococcus - Pneumococcal polysaccharide vaccine 23-valent greater than or equal to 2yo subcutaneous/IM   Partially dictated using Editor, commissioning. Any errors are unintentional.  Halina Maidens, MD [facility]  04/28/2021

## 2021-04-28 NOTE — Patient Instructions (Signed)
Resume Calcium and vitamin D supplement daily.

## 2021-04-29 ENCOUNTER — Encounter: Payer: Self-pay | Admitting: Internal Medicine

## 2021-04-29 LAB — COMPREHENSIVE METABOLIC PANEL
ALT: 15 IU/L (ref 0–32)
AST: 19 IU/L (ref 0–40)
Albumin/Globulin Ratio: 2.4 — ABNORMAL HIGH (ref 1.2–2.2)
Albumin: 4.6 g/dL (ref 3.8–4.8)
Alkaline Phosphatase: 106 IU/L (ref 44–121)
BUN/Creatinine Ratio: 14 (ref 12–28)
BUN: 12 mg/dL (ref 8–27)
Bilirubin Total: 0.4 mg/dL (ref 0.0–1.2)
CO2: 22 mmol/L (ref 20–29)
Calcium: 9.4 mg/dL (ref 8.7–10.3)
Chloride: 104 mmol/L (ref 96–106)
Creatinine, Ser: 0.87 mg/dL (ref 0.57–1.00)
Globulin, Total: 1.9 g/dL (ref 1.5–4.5)
Glucose: 93 mg/dL (ref 70–99)
Potassium: 4.9 mmol/L (ref 3.5–5.2)
Sodium: 138 mmol/L (ref 134–144)
Total Protein: 6.5 g/dL (ref 6.0–8.5)
eGFR: 73 mL/min/{1.73_m2} (ref >59)

## 2021-04-29 LAB — LIPID PANEL
Chol/HDL Ratio: 4.9 ratio — ABNORMAL HIGH (ref 0.0–4.4)
Cholesterol, Total: 277 mg/dL — ABNORMAL HIGH (ref 100–199)
HDL: 57 mg/dL (ref >39)
LDL Chol Calc (NIH): 187 mg/dL — ABNORMAL HIGH (ref 0–99)
Triglycerides: 177 mg/dL — ABNORMAL HIGH (ref 0–149)
VLDL Cholesterol Cal: 33 mg/dL (ref 5–40)

## 2021-04-29 LAB — CBC WITH DIFFERENTIAL/PLATELET
Basophils Absolute: 0 10*3/uL (ref 0.0–0.2)
Basos: 1 %
EOS (ABSOLUTE): 0.1 10*3/uL (ref 0.0–0.4)
Eos: 1 %
Hematocrit: 41.3 % (ref 34.0–46.6)
Hemoglobin: 13.4 g/dL (ref 11.1–15.9)
Immature Grans (Abs): 0 10*3/uL (ref 0.0–0.1)
Immature Granulocytes: 0 %
Lymphocytes Absolute: 1.8 10*3/uL (ref 0.7–3.1)
Lymphs: 33 %
MCH: 29.9 pg (ref 26.6–33.0)
MCHC: 32.4 g/dL (ref 31.5–35.7)
MCV: 92 fL (ref 79–97)
Monocytes Absolute: 0.4 10*3/uL (ref 0.1–0.9)
Monocytes: 7 %
Neutrophils Absolute: 3.1 10*3/uL (ref 1.4–7.0)
Neutrophils: 58 %
Platelets: 218 10*3/uL (ref 150–450)
RBC: 4.48 x10E6/uL (ref 3.77–5.28)
RDW: 13 % (ref 11.7–15.4)
WBC: 5.3 10*3/uL (ref 3.4–10.8)

## 2021-04-29 LAB — TSH: TSH: 1.86 u[IU]/mL (ref 0.450–4.500)

## 2021-04-30 ENCOUNTER — Other Ambulatory Visit: Payer: Self-pay | Admitting: Internal Medicine

## 2021-04-30 DIAGNOSIS — E7800 Pure hypercholesterolemia, unspecified: Secondary | ICD-10-CM

## 2021-04-30 MED ORDER — ATORVASTATIN CALCIUM 10 MG PO TABS: 10.0000 mg | ORAL_TABLET | Freq: Every day | ORAL | 1 refills | Status: DC

## 2021-05-30 ENCOUNTER — Other Ambulatory Visit: Payer: Self-pay | Admitting: Internal Medicine

## 2021-05-30 DIAGNOSIS — K219 Gastro-esophageal reflux disease without esophagitis: Secondary | ICD-10-CM

## 2021-05-31 NOTE — Telephone Encounter (Signed)
Requested Prescriptions  Pending Prescriptions Disp Refills   pantoprazole (PROTONIX) 40 MG tablet [Pharmacy Med Name: Pantoprazole Sodium 40 MG Oral Tablet Delayed Release] 90 tablet 1    Sig: Take 1 tablet by mouth once daily     Gastroenterology: Proton Pump Inhibitors Passed - 05/30/2021  9:33 AM      Passed - Valid encounter within last 12 months    Recent Outpatient Visits          1 month ago Gastroesophageal reflux disease, unspecified whether esophagitis present   [facility] Reubin Milan, MD   2 months ago Gastroesophageal reflux disease, unspecified whether esophagitis present   [facility] Reubin Milan, MD   5 months ago COVID-19 virus infection   [facility]ubin Milan, MD   8 months ago Acute non-recurrent maxillary sinusitis   [facility] Reubin Milan, MD   9 months ago Acute conjunctivitis of right eye, unspecified acute conjunctivitis type   [facility] Reubin Milan, MD      Future Appointments            In 5 months Judithann Graves Nyoka Cowden, MD [facility], [facility]

## 2021-06-16 ENCOUNTER — Other Ambulatory Visit: Payer: Self-pay | Admitting: Internal Medicine

## 2021-06-16 DIAGNOSIS — F320 Major depressive disorder, single episode, mild: Secondary | ICD-10-CM

## 2021-06-16 NOTE — Telephone Encounter (Signed)
Requested Prescriptions  ?Pending Prescriptions Disp Refills  ?? escitalopram (LEXAPRO) 20 MG tablet [Pharmacy Med Name: Escitalopram Oxalate 20 MG Oral Tablet] 90 tablet 0  ?  Sig: Take 1 tablet by mouth once daily  ?  ? Psychiatry:  Antidepressants - SSRI Passed - 06/16/2021  8:33 AM  ?  ?  Passed - Completed PHQ-2 or PHQ-9 in the last 360 days  ?  ?  Passed - Valid encounter within last 6 months  ?  Recent Outpatient Visits   ?      ? 1 month ago Gastroesophageal reflux disease, unspecified whether esophagitis present  ? [facility] Reubin Milan, MD  ? 3 months ago Gastroesophageal reflux disease, unspecified whether esophagitis present  ? [facility] Reubin Milan, MD  ? 5 months ago COVID-19 virus infection  ? [facility] Reubin Milan, MD  ? 8 months ago Acute non-recurrent maxillary sinusitis  ? [facility] Reubin Milan, MD  ? 10 months ago Acute conjunctivitis of right eye, unspecified acute conjunctivitis type  ? [facility] Reubin Milan, MD  ?  ?  ?Future Appointments   ?        ? In 4 months Judithann Graves Nyoka Cowden, MD [facility]  ?  ? ?  ?  ?  ? ? ?

## 2021-07-29 ENCOUNTER — Ambulatory Visit: Payer: Medicare PPO | Admitting: Internal Medicine

## 2021-09-29 ENCOUNTER — Ambulatory Visit: Payer: Medicare PPO | Admitting: Internal Medicine

## 2021-09-29 ENCOUNTER — Encounter: Payer: Self-pay | Admitting: Internal Medicine

## 2021-09-29 VITALS — BP 116/68 | HR 66 | Ht 63.5 in | Wt 151.0 lb

## 2021-09-29 DIAGNOSIS — K219 Gastro-esophageal reflux disease without esophagitis: Secondary | ICD-10-CM | POA: Diagnosis not present

## 2021-09-29 DIAGNOSIS — E7800 Pure hypercholesterolemia, unspecified: Secondary | ICD-10-CM

## 2021-09-29 MED ORDER — PANTOPRAZOLE SODIUM 40 MG PO TBEC: 40.0000 mg | DELAYED_RELEASE_TABLET | Freq: Every day | ORAL | 1 refills | Status: DC

## 2021-09-29 NOTE — Progress Notes (Signed)
Date:  09/29/2021   Name:  Sherry Gibson   DOB:  1955-04-22   MRN:  [id number]   Chief Complaint: Hyperlipidemia and Gastroesophageal Reflux  Hyperlipidemia This is a new diagnosis. The problem is uncontrolled. Recent lipid tests were reviewed and are high. Factors aggravating her hyperlipidemia include fatty foods. Pertinent negatives include no chest pain or shortness of breath. Current antihyperlipidemic treatment includes statins (started atorvastatin in Jan.). There are no compliance problems (may be having more joint aches).   Gastroesophageal Reflux She complains of heartburn. She reports no abdominal pain, no belching, no chest pain, no dysphagia, no hoarse voice, no water brash or no wheezing. Gas, Pressure in chest and stomach from gas, Stridor. This is a recurrent problem. The current episode started more than 1 month ago. The problem has been waxing and waning. The symptoms are aggravated by certain foods. Pertinent negatives include no fatigue. She has tried a PPI for the symptoms. The treatment provided moderate relief.    Lab Results  Component Value Date   NA 138 04/28/2021   K 4.9 04/28/2021   CO2 22 04/28/2021   GLUCOSE 93 04/28/2021   BUN 12 04/28/2021   CREATININE 0.87 04/28/2021   CALCIUM 9.4 04/28/2021   EGFR 73 04/28/2021   GFRNONAA 75 11/21/2019   Lab Results  Component Value Date   CHOL 277 (H) 04/28/2021   HDL 57 04/28/2021   LDLCALC 187 (H) 04/28/2021   TRIG 177 (H) 04/28/2021   CHOLHDL 4.9 (H) 04/28/2021   Lab Results  Component Value Date   TSH 1.860 04/28/2021   No results found for: "HGBA1C" Lab Results  Component Value Date   WBC 5.3 04/28/2021   HGB 13.4 04/28/2021   HCT 41.3 04/28/2021   MCV 92 04/28/2021   PLT 218 04/28/2021   Lab Results  Component Value Date   ALT 15 04/28/2021   AST 19 04/28/2021   ALKPHOS 106 04/28/2021   BILITOT 0.4 04/28/2021   No results found for: "25OHVITD2", "25OHVITD3", "VD25OH"   Review of  Systems  Constitutional:  Negative for chills, fatigue, fever and unexpected weight change.  HENT:  Negative for hoarse voice.   Respiratory:  Negative for chest tightness, shortness of breath and wheezing.   Cardiovascular:  Negative for chest pain and palpitations.  Gastrointestinal:  Positive for heartburn. Negative for abdominal pain and dysphagia.  Musculoskeletal:  Positive for arthralgias.  Psychiatric/Behavioral:  Negative for dysphoric mood and sleep disturbance. The patient is not nervous/anxious.     Patient Active Problem List   Diagnosis Date Noted   Fear of flying 02/24/2020   Genital herpes simplex 09/04/2019   GERD (gastroesophageal reflux disease) 09/04/2019   Hypercholesterolemia 09/04/2019   Current mild episode of major depressive disorder without prior episode ([place]) 03/15/2019   Migraine with aura 10/07/2016   Osteopenia 12/30/2013   Personal history of colonic polyps 12/30/2011   Abnormality of gait 02/16/2011    Allergies  Allergen Reactions   Clarithromycin Nausea And Vomiting    Past Surgical History:  Procedure Laterality Date   ABDOMINAL HYSTERECTOMY     had irregular bleeding- still have ovaries    Social History   Tobacco Use   Smoking status: Never   Smokeless tobacco: Never  Vaping Use   Vaping Use: Never used  Substance Use Topics   Alcohol use: Never   Drug use: Never     Medication list has been reviewed and updated.  Current Meds  Medication Sig  atorvastatin (LIPITOR) 10 MG tablet Take 1 tablet (10 mg total) by mouth daily.   escitalopram (LEXAPRO) 20 MG tablet Take 1 tablet by mouth once daily   valACYclovir (VALTREX) 1000 MG tablet Take 1 tablet by mouth once daily (Patient taking differently: as needed.)   [DISCONTINUED] pantoprazole (PROTONIX) 40 MG tablet Take 1 tablet by mouth once daily       09/29/2021    8:17 AM 04/28/2021    9:10 AM 01/01/2021    3:00 PM 09/29/2020    3:04 PM  GAD 7 : Generalized Anxiety Score   Nervous, Anxious, on Edge 0 0 0 0  Control/stop worrying 0 0 0 0  Worry too much - different things 0 0 0 1  Trouble relaxing 0 0 0 0  Restless 0 0 0 0  Easily annoyed or irritable 0 0 0 0  Afraid - awful might happen 0 0 0 0  Total GAD 7 Score 0 0 0 1  Anxiety Difficulty Not difficult at all Not difficult at all         09/29/2021    8:17 AM  Depression screen PHQ 2/9  Decreased Interest 0  Down, Depressed, Hopeless 0  PHQ - 2 Score 0  Altered sleeping 0  Tired, decreased energy 0  Change in appetite 0  Feeling bad or failure about yourself  0  Trouble concentrating 0  Moving slowly or fidgety/restless 0  Suicidal thoughts 0  PHQ-9 Score 0  Difficult doing work/chores Not difficult at all    BP Readings from Last 3 Encounters:  09/29/21 116/68  04/28/21 118/68  03/03/21 108/70    Physical Exam Vitals and nursing note reviewed.  Constitutional:      General: She is not in acute distress.    Appearance: Normal appearance. She is well-developed.  HENT:     Head: Normocephalic and atraumatic.  Cardiovascular:     Rate and Rhythm: Normal rate and regular rhythm.     Heart sounds: No murmur heard. Pulmonary:     Effort: Pulmonary effort is normal. No respiratory distress.     Breath sounds: No wheezing or rhonchi.  Musculoskeletal:     Cervical back: Normal range of motion.     Right lower leg: No edema.     Left lower leg: No edema.  Skin:    General: Skin is warm and dry.     Capillary Refill: Capillary refill takes less than 2 seconds.     Findings: No rash.  Neurological:     General: No focal deficit present.     Mental Status: She is alert and oriented to person, place, and time.  Psychiatric:        Mood and Affect: Mood normal.        Behavior: Behavior normal.     Wt Readings from Last 3 Encounters:  09/29/21 151 lb (68.5 kg)  04/28/21 150 lb 6.4 oz (68.2 kg)  03/03/21 150 lb 9.6 oz (68.3 kg)    BP 116/68   Pulse 66   Ht 5' 3.5" (1.613 m)    Wt 151 lb (68.5 kg)   SpO2 97%   BMI 26.33 kg/m   Assessment and Plan: 1. Gastroesophageal reflux disease, unspecified whether esophagitis present Symptoms well controlled on daily PPI. No red flag signs such as weight loss, n/v, melena Will continue protonix and supplement with Pepcid PRN. - pantoprazole (PROTONIX) 40 MG tablet; Take 1 tablet (40 mg total) by mouth daily.  Dispense:  90 tablet; Refill: 1  2. Hypercholesterolemia Will obtain labs then decide on possible lower dose to reduce side effects. - Lipid panel - Comprehensive metabolic panel   Partially dictated using Dragon software. Any errors are unintentional.  Halina Maidens, MD [facility]  09/29/2021

## 2021-09-30 LAB — COMPREHENSIVE METABOLIC PANEL
ALT: 15 IU/L (ref 0–32)
AST: 20 IU/L (ref 0–40)
Albumin/Globulin Ratio: 2 (ref 1.2–2.2)
Albumin: 4.5 g/dL (ref 3.8–4.8)
Alkaline Phosphatase: 103 IU/L (ref 44–121)
BUN/Creatinine Ratio: 12 (ref 12–28)
BUN: 11 mg/dL (ref 8–27)
Bilirubin Total: 0.4 mg/dL (ref 0.0–1.2)
CO2: 23 mmol/L (ref 20–29)
Calcium: 9.1 mg/dL (ref 8.7–10.3)
Chloride: 102 mmol/L (ref 96–106)
Creatinine, Ser: 0.91 mg/dL (ref 0.57–1.00)
Globulin, Total: 2.3 g/dL (ref 1.5–4.5)
Glucose: 98 mg/dL (ref 70–99)
Potassium: 4.6 mmol/L (ref 3.5–5.2)
Sodium: 140 mmol/L (ref 134–144)
Total Protein: 6.8 g/dL (ref 6.0–8.5)
eGFR: 69 mL/min/{1.73_m2} (ref >59)

## 2021-09-30 LAB — LIPID PANEL
Chol/HDL Ratio: 3.2 ratio (ref 0.0–4.4)
Cholesterol, Total: 168 mg/dL (ref 100–199)
HDL: 52 mg/dL (ref >39)
LDL Chol Calc (NIH): 96 mg/dL (ref 0–99)
Triglycerides: 112 mg/dL (ref 0–149)
VLDL Cholesterol Cal: 20 mg/dL (ref 5–40)

## 2021-10-29 ENCOUNTER — Ambulatory Visit: Payer: Medicare PPO | Admitting: Internal Medicine

## 2021-11-12 ENCOUNTER — Other Ambulatory Visit: Payer: Self-pay | Admitting: Internal Medicine

## 2021-11-12 DIAGNOSIS — F320 Major depressive disorder, single episode, mild: Secondary | ICD-10-CM

## 2021-11-12 NOTE — Telephone Encounter (Signed)
Requested Prescriptions  Pending Prescriptions Disp Refills  . escitalopram (LEXAPRO) 20 MG tablet [Pharmacy Med Name: Escitalopram Oxalate 20 MG Oral Tablet] 90 tablet 0    Sig: Take 1 tablet by mouth once daily     Psychiatry:  Antidepressants - SSRI Passed - 11/12/2021  7:59 AM      Passed - Completed PHQ-2 or PHQ-9 in the last 360 days      Passed - Valid encounter within last 6 months    Recent Outpatient Visits          1 month ago Gastroesophageal reflux disease, unspecified whether esophagitis present   [facility] Reubin Milan, MD   6 months ago Gastroesophageal reflux disease, unspecified whether esophagitis present   [facility] Reubin Milan, MD   8 months ago Gastroesophageal reflux disease, unspecified whether esophagitis present   [facility] Reubin Milan, MD   10 months ago COVID-19 virus infection   [facility] Reubin Milan, MD   1 year ago Acute non-recurrent maxillary sinusitis   [facility] Reubin Milan, MD      Future Appointments            In 4 months Judithann Graves Nyoka Cowden, MD [facility], [facility]

## 2021-11-22 ENCOUNTER — Other Ambulatory Visit: Payer: Self-pay | Admitting: Internal Medicine

## 2021-11-22 DIAGNOSIS — E7800 Pure hypercholesterolemia, unspecified: Secondary | ICD-10-CM

## 2021-11-22 NOTE — Telephone Encounter (Signed)
Requested Prescriptions  Pending Prescriptions Disp Refills  . atorvastatin (LIPITOR) 10 MG tablet [Pharmacy Med Name: Atorvastatin Calcium 10 MG Oral Tablet] 90 tablet 1    Sig: Take 1 tablet by mouth once daily     Cardiovascular:  Antilipid - Statins Failed - 11/22/2021  8:03 AM      Failed - Lipid Panel in normal range within the last 12 months    Cholesterol, Total  Date Value Ref Range Status  09/29/2021 168 100 - 199 mg/dL Final   LDL Chol Calc (NIH)  Date Value Ref Range Status  09/29/2021 96 0 - 99 mg/dL Final   HDL  Date Value Ref Range Status  09/29/2021 52 >39 mg/dL Final   Triglycerides  Date Value Ref Range Status  09/29/2021 112 0 - 149 mg/dL Final         Passed - Patient is not pregnant      Passed - Valid encounter within last 12 months    Recent Outpatient Visits          1 month ago Gastroesophageal reflux disease, unspecified whether esophagitis present   [facility] Reubin [facility], MD   6 months ago Gastroesophageal reflux disease, unspecified whether esophagitis present   [facility] Reubin Milan, MD   8 months ago Gastroesophageal reflux disease, unspecified whether esophagitis present   [facility] Reubin Milan, MD   10 months ago COVID-19 virus infection   [facility] Reubin Milan, MD   1 year ago Acute non-recurrent maxillary sinusitis   [facility] Reubin Milan, MD      Future Appointments            In 4 months Judithann Graves Nyoka Cowden, MD [facility], [facility]

## 2022-01-17 ENCOUNTER — Encounter: Payer: Self-pay | Admitting: Internal Medicine

## 2022-01-19 ENCOUNTER — Ambulatory Visit (INDEPENDENT_AMBULATORY_CARE_PROVIDER_SITE_OTHER): Payer: Medicare PPO

## 2022-01-19 VITALS — BP 110/70 | HR 65 | Ht 64.0 in | Wt 150.0 lb

## 2022-01-19 DIAGNOSIS — Z0000 Encounter for general adult medical examination without abnormal findings: Secondary | ICD-10-CM
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
## 2022-03-15 ENCOUNTER — Other Ambulatory Visit: Payer: Self-pay | Admitting: Internal Medicine

## 2022-03-15 DIAGNOSIS — F320 Major depressive disorder, single episode, mild: Secondary | ICD-10-CM

## 2022-03-27 ENCOUNTER — Ambulatory Visit
Admission: EM | Admit: 2022-03-27 | Discharge: 2022-03-27 | Disposition: A | Discharge status: Home / Self Care | Payer: Medicare PPO | Attending: Emergency Medicine | Admitting: Emergency Medicine

## 2022-03-27 DIAGNOSIS — W57XXXA Bitten or stung by nonvenomous insect and other nonvenomous arthropods, initial encounter: Secondary | ICD-10-CM

## 2022-03-27 DIAGNOSIS — S50862A Insect bite (nonvenomous) of left forearm, initial encounter: Secondary | ICD-10-CM

## 2022-03-27 DIAGNOSIS — L03114 Cellulitis of left upper limb: Secondary | ICD-10-CM

## 2022-03-27 MED ORDER — PREDNISONE 10 MG (21) PO TBPK: ORAL_TABLET | Freq: Every day | ORAL | 0 refills | Status: DC

## 2022-03-27 MED ORDER — CEPHALEXIN 500 MG PO CAPS: 500.0000 mg | ORAL_CAPSULE | Freq: Four times a day (QID) | ORAL | 0 refills | Status: DC

## 2022-03-27 NOTE — Discharge Instructions (Addendum)
Take the cephalexin and prednisone as directed.  Take Zyrtec as directed.    Follow up with your primary care provider.

## 2022-03-27 NOTE — ED Triage Notes (Signed)
Patient to Urgent Care with complaints of multiple insect bites present to her left forearm. Large red and swollen areas. Reports itching and throbbing. Bites occurred yesterday afternoon.  Has been taking benadryl/ using cortisone cream.

## 2022-03-27 NOTE — ED Provider Notes (Signed)
Sherry Gibson    CSN: [id number] Arrival date & time: 03/27/22  1344      History   Chief Complaint Chief Complaint  Patient presents with   Allergic Reaction    Left arm large welks - Entered by patient    HPI Sherry Gibson is a 66 y.o. female.  Patient presents with redness and swelling due to multiple insect bites on her left forearm x 1 day.  She did not see what bit her.  Treatment at home with Benadryl and hydrocortisone cream.  She denies fever, chills, wound drainage, numbness, weakness, or other symptoms.  The history is provided by the patient and medical records.    Past Medical History:  Diagnosis Date   Depression    Elevated cholesterol    GERD (gastroesophageal reflux disease)     Patient Active Problem List   Diagnosis Date Noted   Fear of flying 02/24/2020   Genital herpes simplex 09/04/2019   GERD (gastroesophageal reflux disease) 09/04/2019   Hypercholesterolemia 09/04/2019   Current mild episode of major depressive disorder without prior episode (HCC) 03/15/2019   Migraine with aura 10/07/2016   Osteopenia 12/30/2013   Personal history of colonic polyps 12/30/2011   Abnormality of gait 02/16/2011    Past Surgical History:  Procedure Laterality Date   ABDOMINAL HYSTERECTOMY     had irregular bleeding- still have ovaries    OB History   No obstetric history on file.      Home Medications    Prior to Admission medications   Medication Sig Start Date End Date Taking? Authorizing Provider  cephALEXin (KEFLEX) 500 MG capsule Take 1 capsule (500 mg total) by mouth 4 (four) times daily. 03/27/22  Yes Mickie Bail, NP  predniSONE (STERAPRED UNI-PAK 21 TAB) 10 MG (21) TBPK tablet Take by mouth daily. As directed 03/27/22  Yes Mickie Bail, NP  atorvastatin (LIPITOR) 10 MG tablet Take 1 tablet by mouth once daily 11/22/21   Reubin Milan, MD  escitalopram (LEXAPRO) 20 MG tablet Take 1 tablet by mouth once daily 03/15/22    Reubin Milan, MD  pantoprazole (PROTONIX) 40 MG tablet Take 1 tablet (40 mg total) by mouth daily. 09/29/21   Reubin Milan, MD  valACYclovir (VALTREX) 1000 MG tablet Take 1 tablet by mouth once daily Patient taking differently: as needed. 10/07/20   Reubin Milan, MD    Family History Family History  Problem Relation Age of Onset   Cancer Father    COPD Father    Heart disease Father    Uterine cancer Maternal Grandmother    Heart disease Maternal Grandfather    Breast cancer Paternal Grandmother    Heart disease Paternal Grandfather     Social History Social History   Tobacco Use   Smoking status: Never   Smokeless tobacco: Never  Vaping Use   Vaping Use: Never used  Substance Use Topics   Alcohol use: Never   Drug use: Never     Allergies   Clarithromycin   Review of Systems Review of Systems  Constitutional:  Negative for chills and fever.  Respiratory:  Negative for cough and shortness of breath.   Cardiovascular:  Negative for chest pain and palpitations.  Musculoskeletal:  Negative for arthralgias and joint swelling.  Skin:  Positive for color change, rash and wound.  Neurological:  Negative for weakness and numbness.  All other systems reviewed and are negative.    Physical Exam Triage Vital Signs  ED Triage Vitals  Enc Vitals Group     BP      Pulse      Resp      Temp      Temp src      SpO2      Weight      Height      Head Circumference      Peak Flow      Pain Score      Pain Loc      Pain Edu?      Excl. in GC?    No data found.  Updated Vital Signs BP 127/83   Pulse 79   Temp 97.9 F (36.6 C)   Resp 18   Ht 5\' 4"  (1.626 m)   Wt 152 lb (68.9 kg)   SpO2 96%   BMI 26.09 kg/m   Visual Acuity Right Eye Distance:   Left Eye Distance:   Bilateral Distance:    Right Eye Near:   Left Eye Near:    Bilateral Near:     Physical Exam Vitals and nursing note reviewed.  Constitutional:      General: She is not in  acute distress.    Appearance: Normal appearance. She is well-developed. She is not ill-appearing.  HENT:     Mouth/Throat:     Mouth: Mucous membranes are moist.  Cardiovascular:     Rate and Rhythm: Normal rate and regular rhythm.  Pulmonary:     Effort: Pulmonary effort is normal. No respiratory distress.  Musculoskeletal:        General: No deformity. Normal range of motion.     Cervical back: Neck supple.  Skin:    General: Skin is warm and dry.     Capillary Refill: Capillary refill takes less than 2 seconds.     Findings: Erythema and lesion present.  Neurological:     General: No focal deficit present.     Mental Status: She is alert and oriented to person, place, and time.     Sensory: No sensory deficit.     Motor: No weakness.  Psychiatric:        Mood and Affect: Mood normal.        Behavior: Behavior normal.         UC Treatments / Results  Labs (all labs ordered are listed, but only abnormal results are displayed) Labs Reviewed - No data to display  EKG   Radiology No results found.  Procedures Procedures (including critical care time)  Medications Ordered in UC Medications - No data to display  Initial Impression / Assessment and Plan / UC Course  I have reviewed the triage vital signs and the nursing notes.  Pertinent labs & imaging results that were available during my care of the patient were reviewed by me and considered in my medical decision making (see chart for details).    Cellulitis of left forearm due to insect bites.  Treating today with cephalexin and prednisone.  Discussed Zyrtec daily also.  Education provided on cellulitis.  Instructed patient to follow-up with her PCP for recheck.  She has an appointment scheduled for this Friday.  Instructed to follow-up right away if she notes signs of worsening infection.  ED precautions discussed.  Patient agrees to plan of care.  Final Clinical Impressions(s) / UC Diagnoses   Final  diagnoses:  Cellulitis of forearm, left  Insect bite of left forearm, initial encounter     Discharge Instructions  Take the cephalexin and prednisone as directed.  Take Zyrtec as directed.    Follow up with your primary care provider.        ED Prescriptions     Medication Sig Dispense Auth. Provider   predniSONE (STERAPRED UNI-PAK 21 TAB) 10 MG (21) TBPK tablet Take by mouth daily. As directed 21 tablet Mickie Bail, NP   cephALEXin (KEFLEX) 500 MG capsule Take 1 capsule (500 mg total) by mouth 4 (four) times daily. 28 capsule Mickie Bail, NP      PDMP not reviewed this encounter.   Mickie Bail, NP 03/27/22 1556

## 2022-03-31 ENCOUNTER — Encounter: Payer: Self-pay | Admitting: Internal Medicine

## 2022-03-31 ENCOUNTER — Ambulatory Visit (INDEPENDENT_AMBULATORY_CARE_PROVIDER_SITE_OTHER): Payer: Medicare PPO | Admitting: Internal Medicine

## 2022-03-31 VITALS — BP 122/78 | HR 63 | Ht 64.0 in | Wt 152.0 lb

## 2022-03-31 DIAGNOSIS — F320 Major depressive disorder, single episode, mild: Secondary | ICD-10-CM | POA: Diagnosis not present

## 2022-03-31 DIAGNOSIS — Z0000 Encounter for general adult medical examination without abnormal findings: Secondary | ICD-10-CM | POA: Diagnosis not present

## 2022-03-31 DIAGNOSIS — Z1231 Encounter for screening mammogram for malignant neoplasm of breast: Secondary | ICD-10-CM

## 2022-03-31 DIAGNOSIS — E7800 Pure hypercholesterolemia, unspecified: Secondary | ICD-10-CM | POA: Diagnosis not present

## 2022-03-31 DIAGNOSIS — K219 Gastro-esophageal reflux disease without esophagitis: Secondary | ICD-10-CM

## 2022-03-31 MED ORDER — PANTOPRAZOLE SODIUM 40 MG PO TBEC: 40.0000 mg | DELAYED_RELEASE_TABLET | Freq: Every day | ORAL | 1 refills | Status: DC

## 2022-03-31 NOTE — Progress Notes (Signed)
Date:  03/31/2022   Name:  Sherry Gibson   DOB:  11-12-1955   MRN:  [id number]   Chief Complaint: Annual Exam Sherry Gibson is a 66 y.o. female who presents today for her Complete Annual Exam. She feels well. She reports exercising none. She reports she is sleeping fairly well. Breast complaints none.  Mammogram: 03/2021 at [facility] on 04/25/22 DEXA: 01/2020 Osteopenia Colonoscopy: 11/2014 repeat 10 yrs  Health Maintenance Due  Topic Date Due   MAMMOGRAM  03/19/2022    Immunization History  Administered Date(s) Administered   Fluad Quad(high Dose 65+) 02/24/2020   Influenza Inj Mdck Quad Pf 02/19/2017   Influenza, High Dose Seasonal PF 01/17/2019   Influenza-Unspecified 01/22/2014, 01/20/2021, 12/29/2021   PFIZER(Purple Top)SARS-COV-2 Vaccination 05/05/2019, 05/26/2019, 02/03/2020   Pneumococcal Conjugate-13 11/21/2019   Pneumococcal Polysaccharide-23 04/28/2021   Tdap 09/14/2021   Zoster Recombinat (Shingrix) 10/24/2018, 01/12/2019   Zoster, Live 08/28/2014    Hyperlipidemia This is a chronic problem. The problem is controlled. Pertinent negatives include no chest pain or shortness of breath. Current antihyperlipidemic treatment includes statins. The current treatment provides significant improvement of lipids.  Gastroesophageal Reflux She complains of heartburn. She reports no abdominal pain, no chest pain, no coughing or no wheezing. This is a recurrent problem. The problem occurs occasionally. Pertinent negatives include no fatigue. She has tried a PPI for the symptoms.  Depression        This is a chronic problem.The problem is unchanged.  Associated symptoms include no fatigue and no headaches.  Past treatments include SSRIs - Selective serotonin reuptake inhibitors.  Compliance with treatment is good. Cellulitis of arm - secondary to insect bites.  Went to UC and treated with steroids and antibiotics.  Lab Results  Component Value Date   NA 140 09/29/2021   K  4.6 09/29/2021   CO2 23 09/29/2021   GLUCOSE 98 09/29/2021   BUN 11 09/29/2021   CREATININE 0.91 09/29/2021   CALCIUM 9.1 09/29/2021   EGFR 69 09/29/2021   GFRNONAA 75 11/21/2019   Lab Results  Component Value Date   CHOL 168 09/29/2021   HDL 52 09/29/2021   LDLCALC 96 09/29/2021   TRIG 112 09/29/2021   CHOLHDL 3.2 09/29/2021   Lab Results  Component Value Date   TSH 1.860 04/28/2021   No results found for: "HGBA1C" Lab Results  Component Value Date   WBC 5.3 04/28/2021   HGB 13.4 04/28/2021   HCT 41.3 04/28/2021   MCV 92 04/28/2021   PLT 218 04/28/2021   Lab Results  Component Value Date   ALT 15 09/29/2021   AST 20 09/29/2021   ALKPHOS 103 09/29/2021   BILITOT 0.4 09/29/2021   No results found for: "25OHVITD2", "25OHVITD3", "VD25OH"   Review of Systems  Constitutional:  Negative for chills, fatigue and fever.  HENT:  Negative for congestion, hearing loss, tinnitus, trouble swallowing and voice change.   Eyes:  Negative for visual disturbance.  Respiratory:  Negative for cough, chest tightness, shortness of breath and wheezing.   Cardiovascular:  Negative for chest pain, palpitations and leg swelling.  Gastrointestinal:  Positive for heartburn. Negative for abdominal pain, constipation, diarrhea and vomiting.  Endocrine: Negative for polydipsia and polyuria.  Genitourinary:  Negative for dysuria, frequency, genital sores, vaginal bleeding and vaginal discharge.  Musculoskeletal:  Negative for arthralgias, gait problem and joint swelling.  Skin:  Positive for rash (improving). Negative for color change.  Neurological:  Negative for dizziness, tremors, light-headedness and headaches.  Hematological:  Negative for adenopathy. Does not bruise/bleed easily.  Psychiatric/Behavioral:  Positive for depression. Negative for dysphoric mood and sleep disturbance. The patient is not nervous/anxious.     Patient Active Problem List   Diagnosis Date Noted   Fear of flying  02/24/2020   Genital herpes simplex 09/04/2019   GERD (gastroesophageal reflux disease) 09/04/2019   Hypercholesterolemia 09/04/2019   Current mild episode of major depressive disorder without prior episode ([place]) 03/15/2019   Migraine with aura 10/07/2016   Osteopenia 12/30/2013   Personal history of colonic polyps 12/30/2011   Abnormality of gait 02/16/2011    Allergies  Allergen Reactions   Clarithromycin Nausea And Vomiting    Past Surgical History:  Procedure Laterality Date   ABDOMINAL HYSTERECTOMY     had irregular bleeding- still have ovaries    Social History   Tobacco Use   Smoking status: Never   Smokeless tobacco: Never  Vaping Use   Vaping Use: Never used  Substance Use Topics   Alcohol use: Never   Drug use: Never     Medication list has been reviewed and updated.  Current Meds  Medication Sig   atorvastatin (LIPITOR) 10 MG tablet Take 1 tablet by mouth once daily   cephALEXin (KEFLEX) 500 MG capsule Take 1 capsule (500 mg total) by mouth 4 (four) times daily.   escitalopram (LEXAPRO) 20 MG tablet Take 1 tablet by mouth once daily   valACYclovir (VALTREX) 1000 MG tablet Take 1 tablet by mouth once daily (Patient taking differently: as needed.)   [DISCONTINUED] pantoprazole (PROTONIX) 40 MG tablet Take 1 tablet (40 mg total) by mouth daily.   [DISCONTINUED] predniSONE (STERAPRED UNI-PAK 21 TAB) 10 MG (21) TBPK tablet Take by mouth daily. As directed       03/31/2022    8:16 AM 09/29/2021    8:17 AM 04/28/2021    9:10 AM 01/01/2021    3:00 PM  GAD 7 : Generalized Anxiety Score  Nervous, Anxious, on Edge 0 0 0 0  Control/stop worrying 1 0 0 0  Worry too much - different things 1 0 0 0  Trouble relaxing 1 0 0 0  Restless 1 0 0 0  Easily annoyed or irritable 0 0 0 0  Afraid - awful might happen 0 0 0 0  Total GAD 7 Score 4 0 0 0  Anxiety Difficulty Not difficult at all Not difficult at all Not difficult at all        03/31/2022    8:15 AM  01/19/2022    3:09 PM 09/29/2021    8:17 AM  Depression screen PHQ 2/9  Decreased Interest 0 0 0  Down, Depressed, Hopeless 1 0 0  PHQ - 2 Score 1 0 0  Altered sleeping 2 0 0  Tired, decreased energy 1 0 0  Change in appetite 0 0 0  Feeling bad or failure about yourself  0 0 0  Trouble concentrating 1 0 0  Moving slowly or fidgety/restless 0 0 0  Suicidal thoughts 0 0 0  PHQ-9 Score 5 0 0  Difficult doing work/chores Not difficult at all Not difficult at all Not difficult at all    BP Readings from Last 3 Encounters:  03/31/22 122/78  03/27/22 127/83  01/19/22 110/70    Physical Exam Vitals and nursing note reviewed.  Constitutional:      General: She is not in acute distress.    Appearance: She is well-developed.  HENT:  Head: Normocephalic and atraumatic.     Right Ear: Tympanic membrane and ear canal normal.     Left Ear: Tympanic membrane and ear canal normal.     Nose:     Right Sinus: No maxillary sinus tenderness.     Left Sinus: No maxillary sinus tenderness.  Eyes:     General: No scleral icterus.       Right eye: No discharge.        Left eye: No discharge.     Conjunctiva/sclera: Conjunctivae normal.  Neck:     Thyroid: No thyromegaly.     Vascular: No carotid bruit.  Cardiovascular:     Rate and Rhythm: Normal rate and regular rhythm.     Pulses: Normal pulses.     Heart sounds: Normal heart sounds.  Pulmonary:     Effort: Pulmonary effort is normal. No respiratory distress.     Breath sounds: No wheezing.  Chest:  Breasts:    Right: No mass, nipple discharge, skin change or tenderness.     Left: No mass, nipple discharge, skin change or tenderness.  Abdominal:     General: Bowel sounds are normal.     Palpations: Abdomen is soft.     Tenderness: There is no abdominal tenderness.  Musculoskeletal:     Cervical back: Normal range of motion. No erythema.     Right lower leg: No edema.     Left lower leg: No edema.  Lymphadenopathy:      Cervical: No cervical adenopathy.  Skin:    General: Skin is warm and dry.     Findings: Rash present.       Neurological:     Mental Status: She is alert and oriented to person, place, and time.     Cranial Nerves: No cranial nerve deficit.     Sensory: No sensory deficit.     Deep Tendon Reflexes: Reflexes are normal and symmetric.  Psychiatric:        Attention and Perception: Attention normal.        Mood and Affect: Mood normal.     Wt Readings from Last 3 Encounters:  03/31/22 152 lb (68.9 kg)  03/27/22 152 lb (68.9 kg)  01/19/22 150 lb (68 kg)    BP 122/78   Pulse 63   Ht _0  (1.626 m)   Wt 152 lb (68.9 kg)   SpO2 97%   BMI 26.09 kg/m   Assessment and Plan: Problem List Items Addressed This Visit       Digestive   GERD (gastroesophageal reflux disease) (Chronic)    On Pantoprazole Symptoms well controlled. No red flag signs such as weight loss, n/v, melena        Relevant Medications   pantoprazole (PROTONIX) 40 MG tablet   Other Relevant Orders   CBC with Differential/Platelet     Other   Current mild episode of major depressive disorder without prior episode (HCC) (Chronic)    Clinically stable on current regimen with good control of symptoms, No SI or HI. Will continue current therapy with Lexapro       Relevant Orders   TSH   Hypercholesterolemia (Chronic)    Doing well on statin without side effects. Atorvastatin 5 mg starting 09/2021 Last LDL 96      Relevant Orders   Comprehensive metabolic panel   Lipid panel   Other Visit Diagnoses     Annual physical exam    -  Primary   up to  date on screenings and immunizations rec DEXA next year   Encounter for screening mammogram for breast cancer       schedule for January at [facility].        Partially dictated using Editor, commissioning. Any errors are unintentional.  Halina Maidens, MD [facility]  03/31/2022

## 2022-03-31 NOTE — Assessment & Plan Note (Signed)
Clinically stable on current regimen with good control of symptoms, No SI or HI. Will continue current therapy with Lexapro

## 2022-03-31 NOTE — Assessment & Plan Note (Signed)
On Pantoprazole Symptoms well controlled. No red flag signs such as weight loss, n/v, melena

## 2022-03-31 NOTE — Patient Instructions (Signed)
Magnesium 400 mg daily can help reduce cramps

## 2022-03-31 NOTE — Assessment & Plan Note (Addendum)
Doing well on statin without side effects. Atorvastatin 5 mg starting 09/2021 Last LDL 96

## 2022-04-01 LAB — CBC WITH DIFFERENTIAL/PLATELET
Basophils Absolute: 0 10*3/uL (ref 0.0–0.2)
Basos: 0 %
EOS (ABSOLUTE): 0 10*3/uL (ref 0.0–0.4)
Eos: 0 %
Hematocrit: 38.8 % (ref 34.0–46.6)
Hemoglobin: 12.7 g/dL (ref 11.1–15.9)
Immature Grans (Abs): 0 10*3/uL (ref 0.0–0.1)
Immature Granulocytes: 0 %
Lymphocytes Absolute: 1.3 10*3/uL (ref 0.7–3.1)
Lymphs: 15 %
MCH: 30.2 pg (ref 26.6–33.0)
MCHC: 32.7 g/dL (ref 31.5–35.7)
MCV: 92 fL (ref 79–97)
Monocytes Absolute: 0.8 10*3/uL (ref 0.1–0.9)
Monocytes: 10 %
Neutrophils Absolute: 6.5 10*3/uL (ref 1.4–7.0)
Neutrophils: 75 %
Platelets: 223 10*3/uL (ref 150–450)
RBC: 4.21 x10E6/uL (ref 3.77–5.28)
RDW: 12.9 % (ref 11.7–15.4)
WBC: 8.6 10*3/uL (ref 3.4–10.8)

## 2022-04-01 LAB — COMPREHENSIVE METABOLIC PANEL
ALT: 16 IU/L (ref 0–32)
AST: 12 IU/L (ref 0–40)
Albumin/Globulin Ratio: 2.3 — ABNORMAL HIGH (ref 1.2–2.2)
Albumin: 4.3 g/dL (ref 3.9–4.9)
Alkaline Phosphatase: 95 IU/L (ref 44–121)
BUN/Creatinine Ratio: 20 (ref 12–28)
BUN: 16 mg/dL (ref 8–27)
Bilirubin Total: 0.4 mg/dL (ref 0.0–1.2)
CO2: 23 mmol/L (ref 20–29)
Calcium: 9.1 mg/dL (ref 8.7–10.3)
Chloride: 100 mmol/L (ref 96–106)
Creatinine, Ser: 0.81 mg/dL (ref 0.57–1.00)
Globulin, Total: 1.9 g/dL (ref 1.5–4.5)
Glucose: 95 mg/dL (ref 70–99)
Potassium: 4.1 mmol/L (ref 3.5–5.2)
Sodium: 138 mmol/L (ref 134–144)
Total Protein: 6.2 g/dL (ref 6.0–8.5)
eGFR: 79 mL/min/{1.73_m2} (ref >59)

## 2022-04-01 LAB — LIPID PANEL
Chol/HDL Ratio: 2.8 ratio (ref 0.0–4.4)
Cholesterol, Total: 176 mg/dL (ref 100–199)
HDL: 63 mg/dL (ref >39)
LDL Chol Calc (NIH): 97 mg/dL (ref 0–99)
Triglycerides: 89 mg/dL (ref 0–149)
VLDL Cholesterol Cal: 16 mg/dL (ref 5–40)

## 2022-04-01 LAB — TSH: TSH: 0.477 u[IU]/mL (ref 0.450–4.500)

## 2022-04-26 DIAGNOSIS — Z1231 Encounter for screening mammogram for malignant neoplasm of breast: Secondary | ICD-10-CM | POA: Diagnosis not present

## 2022-05-23 ENCOUNTER — Ambulatory Visit: Payer: Self-pay | Admitting: *Deleted

## 2022-05-23 NOTE — Telephone Encounter (Signed)
Summary: positive covid   Pt called saying she and her husband tested positive for covid and she wants to know if medication can be sent the pharmacy.  [facility] [place]  CB#  [id number]     Chief Complaint: Covid Positive, tested yesterday Symptoms: Dry cough, earache right ear, stuffy,body aches sore throat Frequency: Last Tuesday or Wednesday Pertinent Negatives: Patient denies SOB, fever Disposition: []$ ED /[]$ Urgent Care (no appt availability in office) / [x]$ Appointment(In office/virtual)/ []$  [place] [facility]/ []$ Home Care/ []$ Refused Recommended Disposition /[]$ [facility]/ []$  Follow-up with PCP Additional Notes: Pt was initially calling for husband,(triaged) Secured appt for husband this afternoon. Pt states PCP usually speaks to both of them, advised may ned separate appt. Verbalizes understanding. Care advise provided, verbalizes understanding. Declined review of self isolation guidelines. Reason for Disposition  [1] HIGH RISK patient (e.g., weak immune system, age > 75 years, obesity with BMI 30 or higher, pregnant, chronic lung disease or other chronic medical condition) AND [2] COVID symptoms (e.g., cough, fever)  (Exceptions: Already seen by PCP and no new or worsening symptoms.)  Answer Assessment - Initial Assessment Questions 1. COVID-19 DIAGNOSIS: "How do you know that you have COVID?" (e.g., positive lab test or self-test, diagnosed by doctor or NP/PA, symptoms after exposure).     Home test 2. COVID-19 EXPOSURE: "Was there any known exposure to COVID before the symptoms began?" CDC Definition of close contact: within 6 feet (2 meters) for a total of 15 minutes or more over a 24-hour period.      Yes, son and grandson 3. ONSET: "When did the COVID-19 symptoms start?"      Last mon or tues 4. WORST SYMPTOM: "What is your worst symptom?" (e.g., cough, fever, shortness of breath, muscle aches)      5. COUGH: "Do you have a cough?" If Yes, ask: "How bad is the cough?"        Cough, dry, stuffy 6. FEVER: "Do you have a fever?" If Yes, ask: "What is your temperature, how was it measured, and when did it start?"     no 7. RESPIRATORY STATUS: "Describe your breathing?" (e.g., normal; shortness of breath, wheezing, unable to speak)      None 8. BETTER-SAME-WORSE: "Are you getting better, staying the same or getting worse compared to yesterday?"  If getting worse, ask, "In what way?"      9. OTHER SYMPTOMS: "Do you have any other symptoms?"  (e.g., chills, fatigue, headache, loss of smell or taste, muscle pain, sore throat)     Right earache, neck, body aches, sore throat 10. HIGH RISK DISEASE: "Do you have any chronic medical problems?" (e.g., asthma, heart or lung disease, weak immune system, obesity, etc.)       yes 11. VACCINE: "Have you had the COVID-19 vaccine?" If Yes, ask: "Which one, how many shots, when did you get it?"       4 vaccines, flu, pneumonia \line  Protocols used: Coronavirus (COVID-19) Diagnosed or Suspected-A-AH

## 2022-06-08 ENCOUNTER — Other Ambulatory Visit: Payer: Self-pay | Admitting: Internal Medicine

## 2022-06-08 DIAGNOSIS — F320 Major depressive disorder, single episode, mild: Secondary | ICD-10-CM

## 2022-06-09 NOTE — Telephone Encounter (Signed)
Requested Prescriptions  Pending Prescriptions Disp Refills   escitalopram (LEXAPRO) 20 MG tablet [Pharmacy Med Name: Escitalopram Oxalate 20 MG Oral Tablet] 90 tablet 0    Sig: Take 1 tablet by mouth once daily     Psychiatry:  Antidepressants - SSRI Passed - 06/08/2022 10:34 PM      Passed - Completed PHQ-2 or PHQ-9 in the last 360 days      Passed - Valid encounter within last 6 months    Recent Outpatient Visits           2 months ago Annual physical exam   [place] at [facility], Jesse Sans, MD   8 months ago Gastroesophageal reflux disease, unspecified whether esophagitis present   [place] at [facility], Jesse Sans, MD   1 year ago Gastroesophageal reflux disease, unspecified whether esophagitis present   [place] at [facility], Jesse Sans, MD   1 year ago Gastroesophageal reflux disease, unspecified whether esophagitis present   [place] at [facility], Jesse Sans, MD   1 year ago COVID-19 virus infection   [facility] at [facility], Jesse Sans, MD

## 2022-09-03 ENCOUNTER — Other Ambulatory Visit: Payer: Self-pay | Admitting: Internal Medicine

## 2022-09-03 DIAGNOSIS — F320 Major depressive disorder, single episode, mild: Secondary | ICD-10-CM

## 2022-09-05 ENCOUNTER — Ambulatory Visit
Admission: EM | Admit: 2022-09-05 | Discharge: 2022-09-05 | Disposition: A | Discharge status: Home / Self Care | Payer: Medicare PPO | Attending: Family Medicine | Admitting: Family Medicine

## 2022-09-05 DIAGNOSIS — S40862A Insect bite (nonvenomous) of left upper arm, initial encounter: Secondary | ICD-10-CM

## 2022-09-05 DIAGNOSIS — W57XXXA Bitten or stung by nonvenomous insect and other nonvenomous arthropods, initial encounter: Secondary | ICD-10-CM

## 2022-09-05 DIAGNOSIS — S40861A Insect bite (nonvenomous) of right upper arm, initial encounter: Secondary | ICD-10-CM | POA: Diagnosis not present

## 2022-09-05 MED ORDER — HYDROXYZINE HCL 10 MG PO TABS: 10.0000 mg | ORAL_TABLET | Freq: Three times a day (TID) | ORAL | 0 refills | Status: DC | PRN

## 2022-09-05 MED ORDER — TRIAMCINOLONE ACETONIDE 0.1 % EX OINT: 1.0000 | TOPICAL_OINTMENT | Freq: Four times a day (QID) | CUTANEOUS | 1 refills | Status: DC

## 2022-09-05 NOTE — ED Provider Notes (Signed)
[facility]    CSN: [id number] Arrival date & time: 09/05/22  1108      History   Chief Complaint Chief Complaint  Patient presents with   Insect Bite    HPI Sherry Gibson is a 67 y.o. female.   HPI  [place] for possible insect bites. She woke up Sunday morning while she was half asleep. She has itching and stinging. She has red patches and putting cortisone cream on it without relief. No one else is itching. She has no other symptoms and is otherwise in her normal state of health.        Past Medical History:  Diagnosis Date   Depression    Elevated cholesterol    GERD (gastroesophageal reflux disease)     Patient Active Problem List   Diagnosis Date Noted   Fear of flying 02/24/2020   Genital herpes simplex 09/04/2019   GERD (gastroesophageal reflux disease) 09/04/2019   Hypercholesterolemia 09/04/2019   Current mild episode of major depressive disorder without prior episode (HCC) 03/15/2019   Migraine with aura 10/07/2016   Osteopenia 12/30/2013   Personal history of colonic polyps 12/30/2011   Abnormality of gait 02/16/2011    Past Surgical History:  Procedure Laterality Date   ABDOMINAL HYSTERECTOMY     had irregular bleeding- still have ovaries    OB History   No obstetric history on file.      Home Medications    Prior to Admission medications   Medication Sig Start Date End Date Taking? Authorizing Provider  hydrOXYzine (ATARAX) 10 MG tablet Take 1 tablet (10 mg total) by mouth every 8 (eight) hours as needed. 09/05/22  Yes Nevah Dalal, DO  triamcinolone ointment (KENALOG) 0.1 % Apply 1 Application topically 4 (four) times daily. 09/05/22  Yes Casmer Yepiz, Seward Meth, DO  atorvastatin (LIPITOR) 10 MG tablet Take 1 tablet by mouth once daily 11/22/21   Reubin Milan, MD  escitalopram (LEXAPRO) 20 MG tablet Take 1 tablet by mouth once daily 09/06/22   Reubin Milan, MD  pantoprazole (PROTONIX) 40 MG tablet Take 1 tablet (40 mg  total) by mouth daily. 03/31/22   Reubin Milan, MD  valACYclovir (VALTREX) 1000 MG tablet Take 1 tablet by mouth once daily Patient taking differently: as needed. 10/07/20   Reubin Milan, MD    Family History Family History  Problem Relation Age of Onset   Cancer Father    COPD Father    Heart disease Father    Uterine cancer Maternal Grandmother    Heart disease Maternal Grandfather    Breast cancer Paternal Grandmother    Heart disease Paternal Grandfather     Social History Social History   Tobacco Use   Smoking status: Never   Smokeless tobacco: Never  Vaping Use   Vaping Use: Never used  Substance Use Topics   Alcohol use: Never   Drug use: Never     Allergies   Clarithromycin   Review of Systems Review of Systems :negative unless otherwise stated in HPI.      Physical Exam Triage Vital Signs ED Triage Vitals [09/05/22 1231]  Enc Vitals Group     BP 107/69     Pulse Rate 61     Resp      Temp 98.4 F (36.9 C)     Temp Source Oral     SpO2 95 %     Weight      Height      Head Circumference  Peak Flow      Pain Score 1     Pain Loc      Pain Edu?      Excl. in GC?    No data found.  Updated Vital Signs BP 107/69 (BP Location: Left Arm)   Pulse 61   Temp 98.4 F (36.9 C) (Oral)   SpO2 95%   Visual Acuity Right Eye Distance:   Left Eye Distance:   Bilateral Distance:    Right Eye Near:   Left Eye Near:    Bilateral Near:     Physical Exam  GEN: alert, well appearing female, in no acute distress  EYES: no scleral injection CV: regular rate  RESP: no increased work of breathing MSK: no extremity edema, no gross deformities NEURO: alert, moves all extremities appropriately SKIN: warm and dry; erythematous patches and papules most consistent with bug bites     UC Treatments / Results  Labs (all labs ordered are listed, but only abnormal results are displayed) Labs Reviewed - No data to  display  EKG   Radiology No results found.  Procedures Procedures (including critical care time)  Medications Ordered in UC Medications - No data to display  Initial Impression / Assessment and Plan / UC Course  I have reviewed the triage vital signs and the nursing notes.  Pertinent labs & imaging results that were available during my care of the patient were reviewed by me and considered in my medical decision making (see chart for details).     Patient is a 67 y.o. femalewho presents for rash.  Overall, patient is well-appearing and well-hydrated.  Vital signs stable.  [name] is afebrile.  Exam concerning for bug bites with localized erythema.  Treat with steroid ointment and Atarax for itching. Advised pt to check her clothing, furniture and bed for bed bugs. No sign of infection to suggest antibiotics at this time.     Reviewed expectations regarding course of current medical issues.  All questions asked were answered.  Outlined signs and symptoms indicating need for more acute intervention. Patient verbalized understanding. After Visit Summary given.   Final Clinical Impressions(s) / UC Diagnoses   Final diagnoses:  Insect bite, unspecified site, initial encounter     Discharge Instructions      Stop by the pharmacy to pick up your prescriptions.  Follow up with your primary care provider as needed.      ED Prescriptions     Medication Sig Dispense Auth. Provider   triamcinolone ointment (KENALOG) 0.1 % Apply 1 Application topically 4 (four) times daily. 15 g Nattalie Santiesteban, DO   hydrOXYzine (ATARAX) 10 MG tablet Take 1 tablet (10 mg total) by mouth every 8 (eight) hours as needed. 30 tablet Katha Cabal, DO      PDMP not reviewed this encounter.              Katha Cabal, DO 09/12/22 6045

## 2022-09-05 NOTE — ED Triage Notes (Signed)
Pt presents for insect bite on LT side of forehead, RT upper arm, LT arm, under chin. Pt states she started having itching and burning sensation on Sunday morning around 1am. Pt has been apply cortisone cream and taking benadryl.

## 2022-09-05 NOTE — Discharge Instructions (Signed)
Stop by the pharmacy to pick up your prescriptions.  Follow up with your primary care provider as needed.  

## 2022-09-06 NOTE — Telephone Encounter (Signed)
Requested Prescriptions  Pending Prescriptions Disp Refills   escitalopram (LEXAPRO) 20 MG tablet [Pharmacy Med Name: Escitalopram Oxalate 20 MG Oral Tablet] 90 tablet 0    Sig: Take 1 tablet by mouth once daily     Psychiatry:  Antidepressants - SSRI Passed - 09/03/2022  9:21 AM      Passed - Completed PHQ-2 or PHQ-9 in the last 360 days      Passed - Valid encounter within last 6 months    Recent Outpatient Visits           5 months ago Annual physical exam   [place] [facility] at [facility], Nyoka Cowden, MD   11 months ago Gastroesophageal reflux disease, unspecified whether esophagitis present   [facility] at [facility], Nyoka Cowden, MD   1 year ago Gastroesophageal reflux disease, unspecified whether esophagitis present   [facility] at [facility], Nyoka Cowden, MD   1 year ago Gastroesophageal reflux disease, unspecified whether esophagitis present   [facility] at [facility], Nyoka Cowden, MD   1 year ago COVID-19 virus infection   [facility] at [facility], Nyoka Cowden, MD

## 2022-09-13 ENCOUNTER — Other Ambulatory Visit: Payer: Self-pay | Admitting: Internal Medicine

## 2022-09-13 DIAGNOSIS — E7800 Pure hypercholesterolemia, unspecified: Secondary | ICD-10-CM

## 2022-11-13 ENCOUNTER — Other Ambulatory Visit: Payer: Self-pay | Admitting: Internal Medicine

## 2022-11-13 DIAGNOSIS — K219 Gastro-esophageal reflux disease without esophagitis: Secondary | ICD-10-CM

## 2022-11-14 NOTE — Telephone Encounter (Signed)
Requested Prescriptions  Pending Prescriptions Disp Refills   pantoprazole (PROTONIX) 40 MG tablet [Pharmacy Med Name: Pantoprazole Sodium 40 MG Oral Tablet Delayed Release] 90 tablet 0    Sig: Take 1 tablet by mouth once daily     Gastroenterology: Proton Pump Inhibitors Passed - 11/13/2022  7:52 AM      Passed - Valid encounter within last 12 months    Recent Outpatient Visits           7 months ago Annual physical exam   [place] [facility] at [facility], Nyoka Cowden, MD   1 year ago Gastroesophageal reflux disease, unspecified whether esophagitis present   [facility] at [facility], Nyoka Cowden, MD   1 year ago Gastroesophageal reflux disease, unspecified whether esophagitis present   [facility] at [facility], Nyoka Cowden, MD   1 year ago Gastroesophageal reflux disease, unspecified whether esophagitis present   [facility] Medicine at [facility], Nyoka Cowden, MD   1 year ago COVID-19 virus infection   [facility] at [facility], Nyoka Cowden, MD

## 2022-11-30 ENCOUNTER — Other Ambulatory Visit: Payer: Self-pay | Admitting: Internal Medicine

## 2022-11-30 DIAGNOSIS — F320 Major depressive disorder, single episode, mild: Secondary | ICD-10-CM

## 2022-12-07 ENCOUNTER — Other Ambulatory Visit: Payer: Self-pay | Admitting: Internal Medicine

## 2022-12-07 DIAGNOSIS — E7800 Pure hypercholesterolemia, unspecified: Secondary | ICD-10-CM

## 2023-01-12 ENCOUNTER — Encounter: Payer: Self-pay | Admitting: Internal Medicine

## 2023-01-12 ENCOUNTER — Ambulatory Visit (INDEPENDENT_AMBULATORY_CARE_PROVIDER_SITE_OTHER): Payer: Medicare PPO | Admitting: Internal Medicine

## 2023-01-12 VITALS — BP 112/74 | HR 58 | Ht 64.0 in | Wt 147.0 lb

## 2023-01-12 DIAGNOSIS — E7800 Pure hypercholesterolemia, unspecified: Secondary | ICD-10-CM

## 2023-01-12 DIAGNOSIS — M8580 Other specified disorders of bone density and structure, unspecified site: Secondary | ICD-10-CM | POA: Diagnosis not present

## 2023-01-12 DIAGNOSIS — Z0000 Encounter for general adult medical examination without abnormal findings: Secondary | ICD-10-CM

## 2023-01-12 DIAGNOSIS — N3281 Overactive bladder: Secondary | ICD-10-CM | POA: Diagnosis not present

## 2023-01-12 DIAGNOSIS — F320 Major depressive disorder, single episode, mild: Secondary | ICD-10-CM

## 2023-01-12 DIAGNOSIS — K219 Gastro-esophageal reflux disease without esophagitis: Secondary | ICD-10-CM | POA: Diagnosis not present

## 2023-01-12 DIAGNOSIS — Z1231 Encounter for screening mammogram for malignant neoplasm of breast: Secondary | ICD-10-CM | POA: Diagnosis not present

## 2023-01-12 MED ORDER — ATORVASTATIN CALCIUM 10 MG PO TABS
10.0000 mg | ORAL_TABLET | Freq: Every day | ORAL | 1 refills | Status: DC
Start: 2023-01-12 — End: 2023-07-24

## 2023-01-12 MED ORDER — OXYBUTYNIN CHLORIDE ER 5 MG PO TB24: 5.0000 mg | ORAL_TABLET | Freq: Every day | ORAL | 0 refills | Status: DC

## 2023-01-12 MED ORDER — ESCITALOPRAM OXALATE 20 MG PO TABS
20.0000 mg | ORAL_TABLET | Freq: Every day | ORAL | 3 refills | Status: DC
Start: 2023-01-12 — End: 2023-07-24

## 2023-01-12 MED ORDER — PANTOPRAZOLE SODIUM 40 MG PO TBEC
40.0000 mg | DELAYED_RELEASE_TABLET | Freq: Every day | ORAL | 3 refills | Status: DC
Start: 2023-01-12 — End: 2024-01-17

## 2023-01-12 NOTE — Assessment & Plan Note (Signed)
Trial of Ditropan XL 5 mg.

## 2023-01-12 NOTE — Assessment & Plan Note (Signed)
LDL is  Lab Results  Component Value Date   LDLCALC 97 03/31/2022   Currently being treated with atorvastatin with good compliance and no concerns.

## 2023-01-12 NOTE — Assessment & Plan Note (Signed)
Recommend calcium and vitamin D Repeat DEXA

## 2023-01-12 NOTE — Assessment & Plan Note (Signed)
Clinically stable on current regimen, No SI or HI. On Lexapro 20 mg. No change in management at this time.

## 2023-01-12 NOTE — Patient Instructions (Addendum)
Recommend calcium 1200 mg per day and Vitamin D 400-800 IU daily plus weight bearing exercise to prevent osteoporosis.  Call [facility] to schedule Mammogram in January.

## 2023-01-12 NOTE — Assessment & Plan Note (Signed)
Reflux symptoms are minimal on current therapy - omeprazole. No red flag signs such as weight loss, n/v, melena  

## 2023-01-12 NOTE — Progress Notes (Signed)
Date:  01/12/2023   Name:  Sherry Gibson   DOB:  1955-12-24   MRN:  [id number]   Chief Complaint: Annual Exam Sherry Gibson is a 67 y.o. female who presents today for her Complete Annual Exam. She feels well. She reports exercising- babysitting granddaughter. She reports she is sleeping fairly well. Breast complaints - none.  Mammogram: 04/2022 DEXA: 01/2020 osteopenia Colonoscopy: 11/2014 repeat 10 yrs  Health Maintenance Due  Topic Date Due   COVID-19 Vaccine (4 - 2023-24 season) 12/11/2022   Medicare Annual Wellness (AWV)  01/20/2023    Immunization History  Administered Date(s) Administered   Fluad Quad(high Dose 65+) 02/24/2020   Influenza Inj Mdck Quad Pf 02/19/2017   Influenza, High Dose Seasonal PF 01/17/2019   Influenza-Unspecified 01/20/2021, 12/29/2021, 12/17/2022   PFIZER(Purple Top)SARS-COV-2 Vaccination 05/05/2019, 05/26/2019, 02/03/2020   Pneumococcal Conjugate-13 11/21/2019   Pneumococcal Polysaccharide-23 04/28/2021   Tdap 09/14/2021   Zoster Recombinant(Shingrix) 10/24/2018, 01/12/2019   Zoster, Live 08/28/2014    Depression        This is a chronic problem.The problem is unchanged.  Past treatments include SSRIs - Selective serotonin reuptake inhibitors.  Compliance with treatment is good. Gastroesophageal Reflux She complains of heartburn. This is a recurrent problem. The problem occurs rarely. The problem has been unchanged. She has tried a PPI for the symptoms. The treatment provided significant relief.  Hyperlipidemia This is a chronic problem. Current antihyperlipidemic treatment includes statins.  Urinary Frequency  This is a recurrent (leakage with urgency) problem. The problem has been gradually worsening. The patient is experiencing no pain. Associated symptoms include frequency. She has tried nothing for the symptoms.  Osteopenia - last DEXA 2021. No hx of fractures; should be on calcium and vitamin D and consider repeat scan.  Lab Results   Component Value Date   NA 138 03/31/2022   K 4.1 03/31/2022   CO2 23 03/31/2022   GLUCOSE 95 03/31/2022   BUN 16 03/31/2022   CREATININE 0.81 03/31/2022   CALCIUM 9.1 03/31/2022   EGFR 79 03/31/2022   GFRNONAA 75 11/21/2019   Lab Results  Component Value Date   CHOL 176 03/31/2022   HDL 63 03/31/2022   LDLCALC 97 03/31/2022   TRIG 89 03/31/2022   CHOLHDL 2.8 03/31/2022   Lab Results  Component Value Date   TSH 0.477 03/31/2022   No results found for: "HGBA1C" Lab Results  Component Value Date   WBC 8.6 03/31/2022   HGB 12.7 03/31/2022   HCT 38.8 03/31/2022   MCV 92 03/31/2022   PLT 223 03/31/2022   Lab Results  Component Value Date   ALT 16 03/31/2022   AST 12 03/31/2022   ALKPHOS 95 03/31/2022   BILITOT 0.4 03/31/2022   No results found for: "25OHVITD2", "25OHVITD3", "VD25OH"   Patient Active Problem List   Diagnosis Date Noted   OAB (overactive bladder) 01/12/2023   Fear of flying 02/24/2020   Genital herpes simplex 09/04/2019   GERD (gastroesophageal reflux disease) 09/04/2019   Hypercholesterolemia 09/04/2019   Current mild episode of major depressive disorder without prior episode (HCC) 03/15/2019   Migraine with aura 10/07/2016   Osteopenia 12/30/2013   History of colonic polyps 12/30/2011   Abnormality of gait 02/16/2011    Allergies  Allergen Reactions   Clarithromycin Nausea And Vomiting    Past Surgical History:  Procedure Laterality Date   ABDOMINAL HYSTERECTOMY     had irregular bleeding- still have ovaries    Social History   Tobacco  Use   Smoking status: Never   Smokeless tobacco: Never  Vaping Use   Vaping status: Never Used  Substance Use Topics   Alcohol use: Never   Drug use: Never     Medication list has been reviewed and updated.  Current Meds  Medication Sig   hydrOXYzine (ATARAX) 10 MG tablet Take 1 tablet (10 mg total) by mouth every 8 (eight) hours as needed.   oxybutynin (DITROPAN XL) 5 MG 24 hr tablet Take  1 tablet (5 mg total) by mouth at bedtime.   triamcinolone ointment (KENALOG) 0.1 % Apply 1 Application topically 4 (four) times daily.   valACYclovir (VALTREX) 1000 MG tablet Take 1 tablet by mouth once daily (Patient taking differently: as needed.)   [DISCONTINUED] atorvastatin (LIPITOR) 10 MG tablet Take 1 tablet by mouth once daily   [DISCONTINUED] escitalopram (LEXAPRO) 20 MG tablet Take 1 tablet by mouth once daily   [DISCONTINUED] pantoprazole (PROTONIX) 40 MG tablet Take 1 tablet by mouth once daily       01/12/2023    9:50 AM 03/31/2022    8:16 AM 09/29/2021    8:17 AM 04/28/2021    9:10 AM  GAD 7 : Generalized Anxiety Score  Nervous, Anxious, on Edge 0 0 0 0  Control/stop worrying 0 1 0 0  Worry too much - different things 1 1 0 0  Trouble relaxing 1 1 0 0  Restless 0 1 0 0  Easily annoyed or irritable 1 0 0 0  Afraid - awful might happen 0 0 0 0  Total GAD 7 Score 3 4 0 0  Anxiety Difficulty Not difficult at all Not difficult at all Not difficult at all Not difficult at all       01/12/2023    9:50 AM 03/31/2022    8:15 AM 01/19/2022    3:09 PM  Depression screen PHQ 2/9  Decreased Interest 0 0 0  Down, Depressed, Hopeless 0 1 0  PHQ - 2 Score 0 1 0  Altered sleeping 1 2 0  Tired, decreased energy 1 1 0  Change in appetite 0 0 0  Feeling bad or failure about yourself  0 0 0  Trouble concentrating 0 1 0  Moving slowly or fidgety/restless 0 0 0  Suicidal thoughts 0 0 0  PHQ-9 Score 2 5 0  Difficult doing work/chores Not difficult at all Not difficult at all Not difficult at all    BP Readings from Last 3 Encounters:  01/12/23 112/74  09/05/22 107/69  03/31/22 122/78    Physical Exam Vitals and nursing note reviewed.  Constitutional:      General: She is not in acute distress.    Appearance: She is well-developed.  HENT:     Head: Normocephalic and atraumatic.     Right Ear: Tympanic membrane and ear canal normal.     Left Ear: Tympanic membrane and ear  canal normal.     Nose:     Right Sinus: No maxillary sinus tenderness.     Left Sinus: No maxillary sinus tenderness.  Eyes:     General: No scleral icterus.       Right eye: No discharge.        Left eye: No discharge.     Conjunctiva/sclera: Conjunctivae normal.  Neck:     Thyroid: No thyromegaly.     Vascular: No carotid bruit.  Cardiovascular:     Rate and Rhythm: Normal rate and regular rhythm.  Pulses: Normal pulses.     Heart sounds: Normal heart sounds.  Pulmonary:     Effort: Pulmonary effort is normal. No respiratory distress.     Breath sounds: No wheezing.  Chest:  Breasts:    Right: No mass, nipple discharge, skin change or tenderness.     Left: No mass, nipple discharge, skin change or tenderness.  Abdominal:     General: Bowel sounds are normal.     Palpations: Abdomen is soft.     Tenderness: There is no abdominal tenderness.  Musculoskeletal:     Cervical back: Normal range of motion. No erythema.     Right lower leg: No edema.     Left lower leg: No edema.  Lymphadenopathy:     Cervical: No cervical adenopathy.  Skin:    General: Skin is warm and dry.     Findings: No rash.  Neurological:     Mental Status: She is alert and oriented to person, place, and time.     Cranial Nerves: No cranial nerve deficit.     Sensory: No sensory deficit.     Deep Tendon Reflexes: Reflexes are normal and symmetric.  Psychiatric:        Attention and Perception: Attention normal.        Mood and Affect: Mood normal.     Wt Readings from Last 3 Encounters:  01/12/23 147 lb (66.7 kg)  03/31/22 152 lb (68.9 kg)  03/27/22 152 lb (68.9 kg)    BP 112/74   Pulse (!) 58   Ht 5\' 4"  (1.626 m)   Wt 147 lb (66.7 kg)   SpO2 98%   BMI 25.23 kg/m   Assessment and Plan:  Problem List Items Addressed This Visit       Unprioritized   Current mild episode of major depressive disorder without prior episode (HCC) (Chronic)    Clinically stable on current regimen, No  SI or HI. On Lexapro 20 mg. No change in management at this time.       Relevant Medications   escitalopram (LEXAPRO) 20 MG tablet   Other Relevant Orders   TSH   GERD (gastroesophageal reflux disease) (Chronic)    Reflux symptoms are minimal on current therapy - omeprazole. No red flag signs such as weight loss, n/v, melena       Relevant Medications   pantoprazole (PROTONIX) 40 MG tablet   Other Relevant Orders   CBC with Differential/Platelet   Hypercholesterolemia (Chronic)    LDL is  Lab Results  Component Value Date   LDLCALC 97 03/31/2022   Currently being treated with atorvastatin with good compliance and no concerns.       Relevant Medications   atorvastatin (LIPITOR) 10 MG tablet   Other Relevant Orders   Comprehensive metabolic panel   Lipid panel   OAB (overactive bladder)    Trial of Ditropan XL 5 mg.      Relevant Medications   oxybutynin (DITROPAN XL) 5 MG 24 hr tablet   Osteopenia (Chronic)    Recommend calcium and vitamin D Repeat DEXA      Relevant Orders   VITAMIN D 25 Hydroxy (Vit-D Deficiency, Fractures)   Other Visit Diagnoses     Annual physical exam    -  Primary   Encounter for screening mammogram for breast cancer       due in January at [facility]       No follow-ups on file.    Reubin Milan, MD  [facility] [place]

## 2023-01-13 LAB — COMPREHENSIVE METABOLIC PANEL
ALT: 16 [IU]/L (ref 0–32)
AST: 20 [IU]/L (ref 0–40)
Albumin: 4.4 g/dL (ref 3.9–4.9)
Alkaline Phosphatase: 110 [IU]/L (ref 44–121)
BUN/Creatinine Ratio: 16 (ref 12–28)
BUN: 13 mg/dL (ref 8–27)
Bilirubin Total: 0.4 mg/dL (ref 0.0–1.2)
CO2: 24 mmol/L (ref 20–29)
Calcium: 9.6 mg/dL (ref 8.7–10.3)
Chloride: 105 mmol/L (ref 96–106)
Creatinine, Ser: 0.83 mg/dL (ref 0.57–1.00)
Globulin, Total: 2.2 g/dL (ref 1.5–4.5)
Glucose: 94 mg/dL (ref 70–99)
Potassium: 5.1 mmol/L (ref 3.5–5.2)
Sodium: 140 mmol/L (ref 134–144)
Total Protein: 6.6 g/dL (ref 6.0–8.5)
eGFR: 76 mL/min/{1.73_m2} (ref >59)

## 2023-01-13 LAB — CBC WITH DIFFERENTIAL/PLATELET
Basophils Absolute: 0 10*3/uL (ref 0.0–0.2)
Basos: 1 %
EOS (ABSOLUTE): 0.1 10*3/uL (ref 0.0–0.4)
Eos: 2 %
Hematocrit: 41.4 % (ref 34.0–46.6)
Hemoglobin: 13.4 g/dL (ref 11.1–15.9)
Immature Grans (Abs): 0 10*3/uL (ref 0.0–0.1)
Immature Granulocytes: 0 %
Lymphocytes Absolute: 2.1 10*3/uL (ref 0.7–3.1)
Lymphs: 33 %
MCH: 29.9 pg (ref 26.6–33.0)
MCHC: 32.4 g/dL (ref 31.5–35.7)
MCV: 92 fL (ref 79–97)
Monocytes Absolute: 0.5 10*3/uL (ref 0.1–0.9)
Monocytes: 7 %
Neutrophils Absolute: 3.8 10*3/uL (ref 1.4–7.0)
Neutrophils: 57 %
Platelets: 229 10*3/uL (ref 150–450)
RBC: 4.48 x10E6/uL (ref 3.77–5.28)
RDW: 13 % (ref 11.7–15.4)
WBC: 6.5 10*3/uL (ref 3.4–10.8)

## 2023-01-13 LAB — LIPID PANEL
Chol/HDL Ratio: 2.9 {ratio} (ref 0.0–4.4)
Cholesterol, Total: 164 mg/dL (ref 100–199)
HDL: 56 mg/dL (ref >39)
LDL Chol Calc (NIH): 86 mg/dL (ref 0–99)
Triglycerides: 127 mg/dL (ref 0–149)
VLDL Cholesterol Cal: 22 mg/dL (ref 5–40)

## 2023-01-13 LAB — TSH: TSH: 0.84 u[IU]/mL (ref 0.450–4.500)

## 2023-01-13 LAB — VITAMIN D 25 HYDROXY (VIT D DEFICIENCY, FRACTURES): Vit D, 25-Hydroxy: 22.6 ng/mL — ABNORMAL LOW (ref 30.0–100.0)

## 2023-01-25 ENCOUNTER — Ambulatory Visit: Payer: Medicare PPO

## 2023-01-25 DIAGNOSIS — Z0000 Encounter for general adult medical examination without abnormal findings: Secondary | ICD-10-CM

## 2023-01-25 NOTE — Patient Instructions (Addendum)
Sherry Gibson , Thank you for taking time to come for your Medicare Wellness Visit. I appreciate your ongoing commitment to your health goals. Please review the following plan we discussed and let me know if I can assist you in the future.   Referrals/Orders/Follow-Ups/Clinician Recommendations: none  This is a list of the screening recommended for you and due dates:  Health Maintenance  Topic Date Due   COVID-19 Vaccine (4 - 2023-24 season) 12/11/2022   Mammogram  04/27/2023   Medicare Annual Wellness Visit  01/25/2024   Colon Cancer Screening  11/30/2024   DTaP/Tdap/Td vaccine (2 - Td or Tdap) 09/15/2031   Pneumonia Vaccine  Completed   Flu Shot  Completed   DEXA scan (bone density measurement)  Completed   Hepatitis C Screening  Completed   Zoster (Shingles) Vaccine  Completed   HPV Vaccine  Aged Out    Advanced directives: (ACP Link)Information on Advanced Care Planning can be found at [facility] [place] Advance Health Care Directives Advance Health Care Directives ([email or web address]   Next Medicare Annual Wellness Visit scheduled for next year: Yes   01/31/24 @ 11:35 am by video
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
## 2023-02-07 ENCOUNTER — Other Ambulatory Visit: Payer: Self-pay | Admitting: Internal Medicine

## 2023-02-07 DIAGNOSIS — N3281 Overactive bladder: Secondary | ICD-10-CM

## 2023-03-06 ENCOUNTER — Encounter: Payer: Self-pay | Admitting: Internal Medicine

## 2023-03-06 ENCOUNTER — Other Ambulatory Visit: Payer: Self-pay | Admitting: Internal Medicine

## 2023-03-06 DIAGNOSIS — F40243 Fear of flying: Secondary | ICD-10-CM

## 2023-03-06 MED ORDER — ALPRAZOLAM 0.25 MG PO TABS: 0.2500 mg | ORAL_TABLET | Freq: Two times a day (BID) | ORAL | 0 refills | Status: AC | PRN

## 2023-03-06 NOTE — Telephone Encounter (Signed)
Please review.  KP

## 2023-03-29 ENCOUNTER — Encounter: Payer: Self-pay | Admitting: Internal Medicine

## 2023-07-19 LAB — HM MAMMOGRAPHY

## 2023-07-22 ENCOUNTER — Encounter: Payer: Self-pay | Admitting: Internal Medicine

## 2023-07-24 ENCOUNTER — Ambulatory Visit
Admission: RE | Admit: 2023-07-24 | Discharge: 2023-07-24 | Disposition: A | Discharge status: Home / Self Care | Attending: Internal Medicine | Admitting: Internal Medicine

## 2023-07-24 ENCOUNTER — Encounter: Payer: Self-pay | Admitting: Internal Medicine

## 2023-07-24 ENCOUNTER — Ambulatory Visit: Admitting: Internal Medicine

## 2023-07-24 ENCOUNTER — Ambulatory Visit
Admission: RE | Admit: 2023-07-24 | Discharge: 2023-07-24 | Disposition: A | Discharge status: Home / Self Care | Source: Ambulatory Visit | Attending: Internal Medicine | Admitting: Internal Medicine

## 2023-07-24 VITALS — BP 112/72 | HR 80 | Ht 64.0 in | Wt 143.4 lb

## 2023-07-24 DIAGNOSIS — M25552 Pain in left hip: Secondary | ICD-10-CM

## 2023-07-24 MED ORDER — PREDNISONE 10 MG PO TABS: ORAL_TABLET | ORAL | 0 refills | Status: AC

## 2023-07-24 NOTE — Progress Notes (Signed)
 Date:  07/24/2023   Name:  Sherry Gibson   DOB:  [birth year]   MRN:  [id number]   Chief Complaint: Hip Pain (sharp pain down her left leg down to her knee, started Thursday of last week, worse pain Saturday)  Hip Pain  The incident occurred 3 to 5 days ago. There was no injury mechanism. The pain is present in the left hip. Pertinent negatives include no numbness.  Pain started 5 days ago without any new activity or incident.  It worsened over the weekend and now very painful with movement.  Getting up and down is the worst - once she is sitting or lying down she is reasonably comfortable.  She is taking Tylenol and Baclofen regularly; using heat or ice and Biofreeze without much change.  No numbness or weakness.  Weight bearing is minimally painful but walking is uncomfortable.   Review of Systems  Constitutional:  Negative for chills and fever.  Respiratory:  Negative for chest tightness and shortness of breath.   Cardiovascular:  Negative for chest pain.  Musculoskeletal:  Positive for arthralgias, gait problem and myalgias.  Neurological:  Negative for weakness and numbness.     Lab Results  Component Value Date   NA 140 01/12/2023   K 5.1 01/12/2023   CO2 24 01/12/2023   GLUCOSE 94 01/12/2023   BUN 13 01/12/2023   CREATININE 0.83 01/12/2023   CALCIUM 9.6 01/12/2023   EGFR 76 01/12/2023   GFRNONAA 75 11/21/2019   Lab Results  Component Value Date   CHOL 164 01/12/2023   HDL 56 01/12/2023   LDLCALC 86 01/12/2023   TRIG 127 01/12/2023   CHOLHDL 2.9 01/12/2023   Lab Results  Component Value Date   TSH 0.840 01/12/2023   No results found for: "HGBA1C" Lab Results  Component Value Date   WBC 6.5 01/12/2023   HGB 13.4 01/12/2023   HCT 41.4 01/12/2023   MCV 92 01/12/2023   PLT 229 01/12/2023   Lab Results  Component Value Date   ALT 16 01/12/2023   AST 20 01/12/2023   ALKPHOS 110 01/12/2023   BILITOT 0.4 01/12/2023   Lab Results  Component Value Date    VD25OH 22.6 (L) 01/12/2023     Patient Active Problem List   Diagnosis Date Noted   OAB (overactive bladder) 01/12/2023   Fear of flying 02/24/2020   Genital herpes simplex 09/04/2019   GERD (gastroesophageal reflux disease) 09/04/2019   Hypercholesterolemia 09/04/2019   Current mild episode of major depressive disorder without prior episode (HCC) 03/15/2019   Migraine with aura 10/07/2016   Osteopenia 12/30/2013   History of colonic polyps 12/30/2011   Abnormality of gait 02/16/2011    Allergies  Allergen Reactions   Clarithromycin Nausea And Vomiting    Past Surgical History:  Procedure Laterality Date   ABDOMINAL HYSTERECTOMY     had irregular bleeding- still have ovaries    Social History   Tobacco Use   Smoking status: Never   Smokeless tobacco: Never  Vaping Use   Vaping status: Never Used  Substance Use Topics   Alcohol use: Never   Drug use: Never     Medication list has been reviewed and updated.  Current Meds  Medication Sig   ALPRAZolam (XANAX) 0.25 MG tablet Take 1 tablet (0.25 mg total) by mouth 2 (two) times daily as needed for anxiety.   oxybutynin (DITROPAN-XL) 5 MG 24 hr tablet TAKE 1 TABLET BY MOUTH AT BEDTIME   pantoprazole (  PROTONIX) 40 MG tablet Take 1 tablet (40 mg total) by mouth daily.   predniSONE (DELTASONE) 10 MG tablet Take 6 tablets (60 mg total) by mouth daily with breakfast for 2 days, THEN 5 tablets (50 mg total) daily with breakfast for 2 days, THEN 4 tablets (40 mg total) daily with breakfast for 2 days, THEN 3 tablets (30 mg total) daily with breakfast for 2 days, THEN 2 tablets (20 mg total) daily with breakfast for 2 days, THEN 1 tablet (10 mg total) daily with breakfast for 2 days.   simvastatin (ZOCOR) 20 MG tablet Take 1 tablet by mouth at bedtime.       07/24/2023   10:21 AM 01/12/2023    9:50 AM 03/31/2022    8:16 AM 09/29/2021    8:17 AM  GAD 7 : Generalized Anxiety Score  Nervous, Anxious, on Edge 0 0 0 0   Control/stop worrying 0 0 1 0  Worry too much - different things 0 1 1 0  Trouble relaxing 1 1 1  0  Restless 0 0 1 0  Easily annoyed or irritable 1 1 0 0  Afraid - awful might happen 0 0 0 0  Total GAD 7 Score 2 3 4  0  Anxiety Difficulty Not difficult at all Not difficult at all Not difficult at all Not difficult at all       07/24/2023   10:21 AM 01/25/2023    3:19 PM 01/12/2023    9:50 AM  Depression screen PHQ 2/9  Decreased Interest 0 0 0  Down, Depressed, Hopeless 0 0 0  PHQ - 2 Score 0 0 0  Altered sleeping 0 0 1  Tired, decreased energy 0 0 1  Change in appetite 0 0 0  Feeling bad or failure about yourself  0 0 0  Trouble concentrating 0 0 0  Moving slowly or fidgety/restless 0 0 0  Suicidal thoughts 0 0 0  PHQ-9 Score 0 0 2  Difficult doing work/chores Not difficult at all Not difficult at all Not difficult at all    BP Readings from Last 3 Encounters:  07/24/23 112/72  01/12/23 112/74  09/05/22 107/69    Physical Exam Constitutional:      General: She is in acute distress.     Appearance: Normal appearance.  Cardiovascular:     Rate and Rhythm: Normal rate and regular rhythm.  Pulmonary:     Breath sounds: No wheezing or rhonchi.  Musculoskeletal:     Lumbar back: Bony tenderness present.     Right hip: No tenderness or bony tenderness. Normal range of motion.     Left hip: Tenderness and bony tenderness present. Decreased range of motion.     Right upper leg: No tenderness.     Left upper leg: No tenderness.     Right knee: Normal.     Left knee: Normal.     Comments: Over left SI region and lateral hip  Neurological:     Mental Status: She is alert.     Wt Readings from Last 3 Encounters:  07/24/23 143 lb 6 oz (65 kg)  01/12/23 147 lb (66.7 kg)  03/31/22 152 lb (68.9 kg)    BP 112/72   Pulse 80   Ht 5\' 4"  (1.626 m)   Wt 143 lb 6 oz (65 kg)   SpO2 97%   BMI 24.61 kg/m   Assessment and Plan:  Problem List Items Addressed This Visit    None Visit Diagnoses  Acute hip pain, left    -  Primary   suspect acute soft tissue strain +/- bursitis will treat with steroid taper obtain imaging continue Baclofen/heat/ice/topical rubs   Relevant Medications   predniSONE (DELTASONE) 10 MG tablet   Other Relevant Orders   DG Hip Unilat W OR W/O Pelvis 2-3 Views Left       No follow-ups on file.    Sheron Dixons, MD [facility] [place]

## 2023-07-28 ENCOUNTER — Ambulatory Visit: Payer: Self-pay

## 2023-07-28 ENCOUNTER — Other Ambulatory Visit: Payer: Self-pay | Admitting: Internal Medicine

## 2023-07-28 DIAGNOSIS — M25552 Pain in left hip: Secondary | ICD-10-CM

## 2023-07-28 MED ORDER — GABAPENTIN 100 MG PO CAPS: 100.0000 mg | ORAL_CAPSULE | Freq: Every day | ORAL | 0 refills | Status: DC

## 2023-07-28 NOTE — Telephone Encounter (Signed)
  Chief Complaint: request for pain medication Symptoms: hip and leg pain Frequency: constant Pertinent Negatives: Patient denies fever, redness, swelling Disposition: [] ED /[] Urgent Care (no appt availability in office) / [] Appointment(In office/virtual)/ []  [place] [facility]/ [] Home Care/ [] Refused Recommended Disposition /[] [place] [facility]/ [x]  Follow-up with PCP Additional Notes:  Evaluated by Dr. Justus on 07/24/23  for left hip/leg pain. Imaging of left hip shows: Mild degenerative changes at both hips. No acute osseous findings. She started prednisone  Tuesday morning. Pain is not improved. Using Tylenol  as oftern as allowed without any effect. She has mild tingling in her leg when she is sitting. No swelling or redness noted. Pain at rest is constant at 5/10, when sitting or moving, particularly sitting, pain intensifies to 9/10 shooting/stabbing pain. Requesting prescription pain reliever.    Copied from CRM [id number]. Topic: Clinical - Red Word Triage >> Jul 28, 2023  2:15 PM Powell HERO wrote: Red Word that prompted transfer to Nurse Triage: Was in to the office to see the Dr for pain in left hip and leg, had gotten better but the pain has been suddenly getting worse. Reason for Disposition  [1] MODERATE pain (e.g., interferes with normal activities, limping) AND [2] present > 3 days  Protocols used: Hip Pain-A-AH

## 2023-07-28 NOTE — Telephone Encounter (Signed)
 Please review.  KP

## 2023-07-31 NOTE — Telephone Encounter (Signed)
 Pt response.  KP

## 2023-07-31 NOTE — Telephone Encounter (Signed)
 Pt responded .

## 2023-07-31 NOTE — Telephone Encounter (Signed)
 Please review.  KP

## 2023-08-06 ENCOUNTER — Other Ambulatory Visit: Payer: Self-pay | Admitting: Internal Medicine

## 2023-08-06 DIAGNOSIS — N3281 Overactive bladder: Secondary | ICD-10-CM

## 2023-08-08 NOTE — Telephone Encounter (Signed)
 Last OV within protocol.  Requested Prescriptions  Pending Prescriptions Disp Refills   oxybutynin  (DITROPAN -XL) 5 MG 24 hr tablet [Pharmacy Med Name: Oxybutynin  Chloride ER 5 MG Oral Tablet Extended Release 24 Hour] 90 tablet 0    Sig: TAKE 1 TABLET BY MOUTH AT BEDTIME     Urology:  Bladder Agents Failed - 08/08/2023  9:23 AM      Failed - Valid encounter within last 12 months    Recent Outpatient Visits           2 weeks ago Acute hip pain, left   [facility] at [facility], [place], MD

## 2023-08-31 ENCOUNTER — Other Ambulatory Visit: Payer: Self-pay | Admitting: Internal Medicine

## 2023-08-31 DIAGNOSIS — E7800 Pure hypercholesterolemia, unspecified: Secondary | ICD-10-CM

## 2023-09-20 ENCOUNTER — Other Ambulatory Visit: Payer: Self-pay | Admitting: Internal Medicine

## 2023-09-20 DIAGNOSIS — E7800 Pure hypercholesterolemia, unspecified: Secondary | ICD-10-CM

## 2023-09-22 NOTE — Telephone Encounter (Signed)
 Requested by interface surescripts. Medication discontinued 07/24/23.  Requested Prescriptions  Refused Prescriptions Disp Refills   atorvastatin  (LIPITOR) 10 MG tablet [Pharmacy Med Name: Atorvastatin  Calcium  10 MG Oral Tablet] 90 tablet 0    Sig: Take 1 tablet by mouth once daily     Cardiovascular:  Antilipid - Statins Failed - 09/22/2023 12:23 PM      Failed - Valid encounter within last 12 months    Recent Outpatient Visits           2 months ago Acute hip pain, left   [place] [facility] at [facility], Chales [place], MD              Failed - Lipid Panel in normal range within the last 12 months    Cholesterol, Total  Date Value Ref Range Status  01/12/2023 164 100 - 199 mg/dL Final   LDL Chol Calc (NIH)  Date Value Ref Range Status  01/12/2023 86 0 - 99 mg/dL Final   HDL  Date Value Ref Range Status  01/12/2023 56 >39 mg/dL Final   Triglycerides  Date Value Ref Range Status  01/12/2023 127 0 - 149 mg/dL Final         Passed - Patient is not pregnant

## 2023-09-23 ENCOUNTER — Other Ambulatory Visit: Payer: Self-pay | Admitting: Internal Medicine

## 2023-09-23 DIAGNOSIS — E7800 Pure hypercholesterolemia, unspecified: Secondary | ICD-10-CM

## 2023-09-25 ENCOUNTER — Telehealth: Payer: Self-pay

## 2023-09-25 ENCOUNTER — Other Ambulatory Visit: Payer: Self-pay | Admitting: Internal Medicine

## 2023-09-25 NOTE — Telephone Encounter (Signed)
 Copied from CRM [id number]. Topic: Clinical - Medication Question >> Sep 25, 2023  3:54 PM Everette C wrote: Reason for CRM: The patient has requested to please speak directly with C. McAdoo or a member of [employer]. The patient has called to check on the status of their prescription for atorvastatin  (LIPITOR) 10 MG tablet [[id number]]. The patient would like to know if they're supposed to continue taking the medication. Please contact further if/when possible

## 2023-09-25 NOTE — Progress Notes (Unsigned)
 Date:  09/25/2023   Name:  Sherry Gibson   DOB:  June 24, 1955   MRN:  [id number]   Chief Complaint: No chief complaint on file.  HPI  Review of Systems   Lab Results  Component Value Date   NA 140 01/12/2023   K 5.1 01/12/2023   CO2 24 01/12/2023   GLUCOSE 94 01/12/2023   BUN 13 01/12/2023   CREATININE 0.83 01/12/2023   CALCIUM  9.6 01/12/2023   EGFR 76 01/12/2023   GFRNONAA 75 11/21/2019   Lab Results  Component Value Date   CHOL 164 01/12/2023   HDL 56 01/12/2023   LDLCALC 86 01/12/2023   TRIG 127 01/12/2023   CHOLHDL 2.9 01/12/2023   Lab Results  Component Value Date   TSH 0.840 01/12/2023   No results found for: HGBA1C Lab Results  Component Value Date   WBC 6.5 01/12/2023   HGB 13.4 01/12/2023   HCT 41.4 01/12/2023   MCV 92 01/12/2023   PLT 229 01/12/2023   Lab Results  Component Value Date   ALT 16 01/12/2023   AST 20 01/12/2023   ALKPHOS 110 01/12/2023   BILITOT 0.4 01/12/2023   Lab Results  Component Value Date   VD25OH 22.6 (L) 01/12/2023     Patient Active Problem List   Diagnosis Date Noted   OAB (overactive bladder) 01/12/2023   Fear of flying 02/24/2020   Genital herpes simplex 09/04/2019   GERD (gastroesophageal reflux disease) 09/04/2019   Hypercholesterolemia 09/04/2019   Current mild episode of major depressive disorder without prior episode (HCC) 03/15/2019   Migraine with aura 10/07/2016   Osteopenia 12/30/2013   History of colonic polyps 12/30/2011   Abnormality of gait 02/16/2011    Allergies  Allergen Reactions   Clarithromycin Nausea And Vomiting    Past Surgical History:  Procedure Laterality Date   ABDOMINAL HYSTERECTOMY     had irregular bleeding- still have ovaries    Social History   Tobacco Use   Smoking status: Never   Smokeless tobacco: Never  Vaping Use   Vaping status: Never Used  Substance Use Topics   Alcohol use: Never   Drug use: Never     Medication list has been reviewed and  updated.  No outpatient medications have been marked as taking for the 09/25/23 encounter (Orders Only) with Sheron Dixons, MD.       07/24/2023   10:21 AM 01/12/2023    9:50 AM 03/31/2022    8:16 AM 09/29/2021    8:17 AM  GAD 7 : Generalized Anxiety Score  Nervous, Anxious, on Edge 0 0 0 0  Control/stop worrying 0 0 1 0  Worry too much - different things 0 1 1 0  Trouble relaxing 1 1 1  0  Restless 0 0 1 0  Easily annoyed or irritable 1 1 0 0  Afraid - awful might happen 0 0 0 0  Total GAD 7 Score 2 3 4  0  Anxiety Difficulty Not difficult at all Not difficult at all Not difficult at all Not difficult at all       07/24/2023   10:21 AM 01/25/2023    3:19 PM 01/12/2023    9:50 AM  Depression screen PHQ 2/9  Decreased Interest 0 0 0  Down, Depressed, Hopeless 0 0 0  PHQ - 2 Score 0 0 0  Altered sleeping 0 0 1  Tired, decreased energy 0 0 1  Change in appetite 0 0 0  Feeling bad  or failure about yourself  0 0 0  Trouble concentrating 0 0 0  Moving slowly or fidgety/restless 0 0 0  Suicidal thoughts 0 0 0  PHQ-9 Score 0 0 2  Difficult doing work/chores Not difficult at all Not difficult at all Not difficult at all    BP Readings from Last 3 Encounters:  07/24/23 112/72  01/12/23 112/74  09/05/22 107/69    Physical Exam  Wt Readings from Last 3 Encounters:  07/24/23 143 lb 6 oz (65 kg)  01/12/23 147 lb (66.7 kg)  03/31/22 152 lb (68.9 kg)    There were no vitals taken for this visit.  Assessment and Plan:  Problem List Items Addressed This Visit   None   No follow-ups on file.    Sheron Dixons, MD [facility] [place]

## 2023-09-26 ENCOUNTER — Other Ambulatory Visit: Payer: Self-pay

## 2023-09-26 DIAGNOSIS — E7800 Pure hypercholesterolemia, unspecified: Secondary | ICD-10-CM

## 2023-09-26 MED ORDER — ATORVASTATIN CALCIUM 10 MG PO TABS: 10.0000 mg | ORAL_TABLET | Freq: Every day | ORAL | 1 refills | Status: AC

## 2023-09-26 NOTE — Telephone Encounter (Signed)
 Requested by interface surescripts. Last OV 01/12/23 medication reordered today .  Requested Prescriptions  Pending Prescriptions Disp Refills   atorvastatin  (LIPITOR) 10 MG tablet [Pharmacy Med Name: Atorvastatin  Calcium  10 MG Oral Tablet] 90 tablet 1    Sig: Take 1 tablet by mouth once daily     Cardiovascular:  Antilipid - Statins Failed - 09/26/2023 10:51 AM      Failed - Valid encounter within last 12 months    Recent Outpatient Visits           2 months ago Acute hip pain, left   [place] [facility] at [facility], Ch[place], MD              Failed - Lipid Panel in normal range within the last 12 months    Cholesterol, Total  Date Value Ref Range Status  01/12/2023 164 100 - 199 mg/dL Final   LDL Chol Calc (NIH)  Date Value Ref Range Status  01/12/2023 86 0 - 99 mg/dL Final   HDL  Date Value Ref Range Status  01/12/2023 56 >39 mg/dL Final   Triglycerides  Date Value Ref Range Status  01/12/2023 127 0 - 149 mg/dL Final         Passed - Patient is not pregnant

## 2023-09-26 NOTE — Telephone Encounter (Signed)
 Spoke with patient - she has been consistently taking atorvastatin  10 mg since she ran out last week. Refilled Rx.  - Salinda Snedeker M.

## 2023-11-02 ENCOUNTER — Other Ambulatory Visit: Payer: Self-pay | Admitting: Internal Medicine

## 2023-11-02 DIAGNOSIS — N3281 Overactive bladder: Secondary | ICD-10-CM

## 2023-11-03 NOTE — Telephone Encounter (Signed)
 LOV 07/24/2023.    Requested Prescriptions  Pending Prescriptions Disp Refills   oxybutynin  (DITROPAN -XL) 5 MG 24 hr tablet [Pharmacy Med Name: Oxybutynin  Chloride ER 5 MG Oral Tablet Extended Release 24 Hour] 90 tablet 0    Sig: TAKE 1 TABLET BY MOUTH AT BEDTIME     Urology:  Bladder Agents Failed - 11/03/2023  1:12 PM      Failed - Valid encounter within last 12 months    Recent Outpatient Visits           3 months ago Acute hip pain, left   [facility] at [facility], Leita DEL, MD

## 2024-01-16 ENCOUNTER — Encounter: Admitting: Internal Medicine

## 2024-01-17 ENCOUNTER — Ambulatory Visit (INDEPENDENT_AMBULATORY_CARE_PROVIDER_SITE_OTHER): Admitting: Internal Medicine

## 2024-01-17 ENCOUNTER — Encounter: Payer: Self-pay | Admitting: Internal Medicine

## 2024-01-17 VITALS — BP 122/74 | HR 86 | Ht 64.0 in | Wt 146.0 lb

## 2024-01-17 DIAGNOSIS — Z0000 Encounter for general adult medical examination without abnormal findings: Secondary | ICD-10-CM

## 2024-01-17 DIAGNOSIS — F320 Major depressive disorder, single episode, mild: Secondary | ICD-10-CM

## 2024-01-17 DIAGNOSIS — A6004 Herpesviral vulvovaginitis: Secondary | ICD-10-CM | POA: Diagnosis not present

## 2024-01-17 DIAGNOSIS — G2581 Restless legs syndrome: Secondary | ICD-10-CM | POA: Insufficient documentation

## 2024-01-17 DIAGNOSIS — K219 Gastro-esophageal reflux disease without esophagitis: Secondary | ICD-10-CM

## 2024-01-17 DIAGNOSIS — E559 Vitamin D deficiency, unspecified: Secondary | ICD-10-CM | POA: Diagnosis not present

## 2024-01-17 DIAGNOSIS — E7800 Pure hypercholesterolemia, unspecified: Secondary | ICD-10-CM | POA: Diagnosis not present

## 2024-01-17 DIAGNOSIS — Z1231 Encounter for screening mammogram for malignant neoplasm of breast: Secondary | ICD-10-CM

## 2024-01-17 DIAGNOSIS — N3281 Overactive bladder: Secondary | ICD-10-CM

## 2024-01-17 MED ORDER — ESCITALOPRAM OXALATE 20 MG PO TABS: 20.0000 mg | ORAL_TABLET | Freq: Every day | ORAL | 1 refills | Status: DC

## 2024-01-17 MED ORDER — OXYBUTYNIN CHLORIDE ER 5 MG PO TB24: 5.0000 mg | ORAL_TABLET | Freq: Every day | ORAL | 1 refills | Status: AC

## 2024-01-17 MED ORDER — PANTOPRAZOLE SODIUM 40 MG PO TBEC: 40.0000 mg | DELAYED_RELEASE_TABLET | Freq: Every day | ORAL | 1 refills | Status: AC

## 2024-01-17 MED ORDER — GABAPENTIN 100 MG PO CAPS: 100.0000 mg | ORAL_CAPSULE | Freq: Every day | ORAL | 1 refills | Status: AC

## 2024-01-17 NOTE — Assessment & Plan Note (Addendum)
 Currently taking pantoprazole  daily with minimal reflux symptoms.  Her main complaint is belching after eating. Patient denies red flag symptoms - no melena, weight loss, dysphagia. Will maintain current management.

## 2024-01-17 NOTE — Patient Instructions (Addendum)
 Magnesium oxide 400 mg daily.  Add Gabapentin  100 mg about 2 hours before bed.  Take Vitamin D  daily - 1000 international units

## 2024-01-17 NOTE — Assessment & Plan Note (Signed)
 Symptoms suggestive of RLS Recommend gabapentin  100 mg every evening Also daily magnesium supplement

## 2024-01-17 NOTE — Progress Notes (Signed)
 Date:  01/17/2024   Name:  Sherry Gibson   DOB:  1955-06-29   MRN:  [id number]   Chief Complaint: Annual Exam Sherry Gibson is a 68 y.o. female who presents today for her Complete Annual Exam. She feels fairly well. She reports exercising- none. She reports she is sleeping poorly due muscle cramps in legs and feet. Breast complaints - none.  Health Maintenance  Topic Date Due   COVID-19 Vaccine (4 - 2025-26 season) 12/11/2023   Medicare Annual Wellness Visit  01/25/2024   Flu Shot  07/09/2024*   Breast Cancer Screening  07/18/2024   Colon Cancer Screening  11/30/2024   DTaP/Tdap/Td vaccine (2 - Td or Tdap) 09/15/2031   Pneumococcal Vaccine for age over 42  Completed   DEXA scan (bone density measurement)  Completed   Hepatitis C Screening  Completed   Zoster (Shingles) Vaccine  Completed   Meningitis B Vaccine  Aged Out  *Topic was postponed. The date shown is not the original due date.    Gastroesophageal Reflux She reports no abdominal pain, no chest pain, no coughing or no wheezing. Pertinent negatives include no fatigue. She has tried a PPI for the symptoms.  Hyperlipidemia This is a chronic problem. The problem is controlled. Associated symptoms include leg pain and myalgias. Pertinent negatives include no chest pain or shortness of breath. Current antihyperlipidemic treatment includes statins.  Depression        This is a chronic problem.The problem is unchanged.  Associated symptoms include myalgias.  Associated symptoms include no fatigue and no headaches.  Past treatments include SSRIs - Selective serotonin reuptake inhibitors. Leg Pain  There was no injury mechanism. The pain is present in the left leg. The quality of the pain is described as aching and cramping. The pain is moderate.  She has been seeing a chiropractor for the past 6 months which has helped.  She has curvature of the spine, OA of the lumbar and hip region.  Also feels cramps in legs and feet at  night, restlessness that requires her to get up and walk for relief.  She has gabapentin  on her med list but is not taking it.  Review of Systems  Constitutional:  Negative for fatigue and unexpected weight change.  HENT:  Negative for trouble swallowing.   Eyes:  Negative for visual disturbance.  Respiratory:  Negative for cough, chest tightness, shortness of breath and wheezing.   Cardiovascular:  Negative for chest pain, palpitations and leg swelling.  Gastrointestinal:  Negative for abdominal pain, constipation and diarrhea.  Genitourinary:  Negative for dysuria and urgency.  Musculoskeletal:  Positive for arthralgias and myalgias.  Skin:  Negative for color change and rash.  Neurological:  Negative for dizziness, weakness, light-headedness and headaches.  Psychiatric/Behavioral:  Positive for depression and sleep disturbance. Negative for dysphoric mood. The patient is not nervous/anxious.      Lab Results  Component Value Date   NA 140 01/12/2023   K 5.1 01/12/2023   CO2 24 01/12/2023   GLUCOSE 94 01/12/2023   BUN 13 01/12/2023   CREATININE 0.83 01/12/2023   CALCIUM  9.6 01/12/2023   EGFR 76 01/12/2023   GFRNONAA 75 11/21/2019   Lab Results  Component Value Date   CHOL 164 01/12/2023   HDL 56 01/12/2023   LDLCALC 86 01/12/2023   TRIG 127 01/12/2023   CHOLHDL 2.9 01/12/2023   Lab Results  Component Value Date   TSH 0.840 01/12/2023   No results found for:  HGBA1C Lab Results  Component Value Date   WBC 6.5 01/12/2023   HGB 13.4 01/12/2023   HCT 41.4 01/12/2023   MCV 92 01/12/2023   PLT 229 01/12/2023   Lab Results  Component Value Date   ALT 16 01/12/2023   AST 20 01/12/2023   ALKPHOS 110 01/12/2023   BILITOT 0.4 01/12/2023   Lab Results  Component Value Date   VD25OH 22.6 (L) 01/12/2023     Patient Active Problem List   Diagnosis Date Noted   Restless leg syndrome 01/17/2024   Vitamin D  deficiency 01/17/2024   OAB (overactive bladder) 01/12/2023    Fear of flying 02/24/2020   Genital herpes simplex 09/04/2019   GERD (gastroesophageal reflux disease) 09/04/2019   Hypercholesterolemia 09/04/2019   Current mild episode of major depressive disorder without prior episode 03/15/2019   Migraine with aura 10/07/2016   Osteopenia 12/30/2013   History of colonic polyps 12/30/2011   Abnormality of gait 02/16/2011    Allergies  Allergen Reactions   Clarithromycin Nausea And Vomiting    Past Surgical History:  Procedure Laterality Date   ABDOMINAL HYSTERECTOMY     had irregular bleeding- still have ovaries    Social History   Tobacco Use   Smoking status: Never   Smokeless tobacco: Never  Vaping Use   Vaping status: Never Used  Substance Use Topics   Alcohol use: Never   Drug use: Never     Medication list has been reviewed and updated.  Current Meds  Medication Sig   ALPRAZolam  (XANAX ) 0.25 MG tablet Take 1 tablet (0.25 mg total) by mouth 2 (two) times daily as needed for anxiety.   atorvastatin  (LIPITOR) 10 MG tablet Take 1 tablet (10 mg total) by mouth daily.   escitalopram  (LEXAPRO ) 20 MG tablet Take 1 tablet (20 mg total) by mouth daily.   gabapentin  (NEURONTIN ) 100 MG capsule Take 1 capsule (100 mg total) by mouth at bedtime.   oxybutynin  (DITROPAN -XL) 5 MG 24 hr tablet Take 1 tablet (5 mg total) by mouth at bedtime.   pantoprazole  (PROTONIX ) 40 MG tablet Take 1 tablet (40 mg total) by mouth daily.   valACYclovir  (VALTREX ) 1000 MG tablet Take 1,000 mg by mouth 2 (two) times daily.   [DISCONTINUED] escitalopram  (LEXAPRO ) 20 MG tablet Take 20 mg by mouth daily.   [DISCONTINUED] gabapentin  (NEURONTIN ) 100 MG capsule Take 1 capsule (100 mg total) by mouth at bedtime.   [DISCONTINUED] oxybutynin  (DITROPAN -XL) 5 MG 24 hr tablet TAKE 1 TABLET BY MOUTH AT BEDTIME   [DISCONTINUED] pantoprazole  (PROTONIX ) 40 MG tablet Take 1 tablet (40 mg total) by mouth daily.       01/17/2024    8:05 AM 07/24/2023   10:21 AM 01/12/2023     9:50 AM 03/31/2022    8:16 AM  GAD 7 : Generalized Anxiety Score  Nervous, Anxious, on Edge 0 0 0 0  Control/stop worrying 0 0 0 1  Worry too much - different things 0 0 1 1  Trouble relaxing 0 1 1 1   Restless 0 0 0 1  Easily annoyed or irritable 0 1 1 0  Afraid - awful might happen 0 0 0 0  Total GAD 7 Score 0 2 3 4   Anxiety Difficulty Not difficult at all Not difficult at all Not difficult at all Not difficult at all       01/17/2024    8:04 AM 07/24/2023   10:21 AM 01/25/2023    3:19 PM  Depression screen  PHQ 2/9  Decreased Interest 0 0 0  Down, Depressed, Hopeless 0 0 0  PHQ - 2 Score 0 0 0  Altered sleeping 0 0 0  Tired, decreased energy 0 0 0  Change in appetite 0 0 0  Feeling bad or failure about yourself  0 0 0  Trouble concentrating 0 0 0  Moving slowly or fidgety/restless 0 0 0  Suicidal thoughts 0 0 0  PHQ-9 Score 0 0 0  Difficult doing work/chores Not difficult at all Not difficult at all Not difficult at all    BP Readings from Last 3 Encounters:  01/17/24 122/74  07/24/23 112/72  01/12/23 112/74    Physical Exam Vitals and nursing note reviewed.  Constitutional:      General: She is not in acute distress.    Appearance: She is well-developed.  HENT:     Head: Normocephalic and atraumatic.     Right Ear: Tympanic membrane and ear canal normal.     Left Ear: Tympanic membrane and ear canal normal.     Nose:     Right Sinus: No maxillary sinus tenderness.     Left Sinus: No maxillary sinus tenderness.  Eyes:     General: No scleral icterus.       Right eye: No discharge.        Left eye: No discharge.     Conjunctiva/sclera: Conjunctivae normal.  Neck:     Thyroid : No thyromegaly.     Vascular: No carotid bruit.  Cardiovascular:     Rate and Rhythm: Normal rate and regular rhythm.     Pulses: Normal pulses.     Heart sounds: Normal heart sounds.  Pulmonary:     Effort: Pulmonary effort is normal. No respiratory distress.     Breath  sounds: No wheezing.  Abdominal:     General: Bowel sounds are normal.     Palpations: Abdomen is soft.     Tenderness: There is no abdominal tenderness.  Musculoskeletal:     Cervical back: Normal range of motion. No erythema.     Right lower leg: No edema.     Left lower leg: No edema.  Lymphadenopathy:     Cervical: No cervical adenopathy.  Skin:    General: Skin is warm and dry.     Capillary Refill: Capillary refill takes less than 2 seconds.     Findings: No rash.  Neurological:     Mental Status: She is alert and oriented to person, place, and time.     Cranial Nerves: No cranial nerve deficit.     Sensory: No sensory deficit.     Deep Tendon Reflexes: Reflexes are normal and symmetric.  Psychiatric:        Attention and Perception: Attention normal.        Mood and Affect: Mood normal.     Wt Readings from Last 3 Encounters:  01/17/24 146 lb (66.2 kg)  07/24/23 143 lb 6 oz (65 kg)  01/12/23 147 lb (66.7 kg)    BP 122/74   Pulse 86   Ht 5' 4 (1.626 m)   Wt 146 lb (66.2 kg)   SpO2 97%   BMI 25.06 kg/m   Assessment and Plan:  Problem List Items Addressed This Visit       Unprioritized   Current mild episode of major depressive disorder without prior episode (Chronic)   Depression and anxiety symptoms are stable and well controlled on Lexapro  and PRN xanax . No SI/HI  reported. I recommend continuing the same medical regimen.       Relevant Medications   escitalopram  (LEXAPRO ) 20 MG tablet   Other Relevant Orders   TSH   Genital herpes simplex   Has not needed Valtrex  in some time. She still has half a bottle of pills that can be uses so will call for refill if needed.      Relevant Medications   valACYclovir  (VALTREX ) 1000 MG tablet   GERD (gastroesophageal reflux disease) (Chronic)   Currently taking pantoprazole  daily with minimal reflux symptoms.  Her main complaint is belching after eating. Patient denies red flag symptoms - no melena, weight  loss, dysphagia. Will maintain current management.       Relevant Medications   pantoprazole  (PROTONIX ) 40 MG tablet   Other Relevant Orders   CBC with Differential/Platelet   Hypercholesterolemia (Chronic)   LDL is  Lab Results  Component Value Date   LDLCALC 86 01/12/2023    Current medication regimen is atorvastatin . Goal LDL is < 100.       Relevant Orders   Comprehensive metabolic panel with GFR   Lipid panel   OAB (overactive bladder)   Symptoms are well controlled on ditropan  daily.      Relevant Medications   oxybutynin  (DITROPAN -XL) 5 MG 24 hr tablet   Restless leg syndrome   Symptoms suggestive of RLS Recommend gabapentin  100 mg every evening Also daily magnesium supplement      Relevant Medications   gabapentin  (NEURONTIN ) 100 MG capsule   Vitamin D  deficiency   Recommend daily supplement - 1000 international units      Relevant Orders   VITAMIN D  25 Hydroxy (Vit-D Deficiency, Fractures)   Other Visit Diagnoses       Annual physical exam    -  Primary   up to date on screenings and immunizations     Encounter for screening mammogram for breast cancer       due in April at [facility]       Return in about 6 months (around 07/17/2024) for [facility] - Dr. Lemon.    Leita HILARIO Adie, MD [facility] [place]

## 2024-01-17 NOTE — Assessment & Plan Note (Signed)
 Depression and anxiety symptoms are stable and well controlled on Lexapro  and PRN xanax . No SI/HI reported. I recommend continuing the same medical regimen.

## 2024-01-17 NOTE — Assessment & Plan Note (Signed)
 Symptoms are well controlled on ditropan  daily.

## 2024-01-17 NOTE — Assessment & Plan Note (Signed)
 LDL is  Lab Results  Component Value Date   LDLCALC 86 01/12/2023    Current medication regimen is atorvastatin . Goal LDL is < 100.

## 2024-01-17 NOTE — Assessment & Plan Note (Signed)
 Has not needed Valtrex  in some time. She still has half a bottle of pills that can be uses so will call for refill if needed.

## 2024-01-17 NOTE — Assessment & Plan Note (Signed)
 Recommend daily supplement - 1000 international units

## 2024-01-18 ENCOUNTER — Ambulatory Visit: Payer: Self-pay | Admitting: Internal Medicine

## 2024-01-18 LAB — LIPID PANEL
Chol/HDL Ratio: 3.3 ratio (ref 0.0–4.4)
Cholesterol, Total: 164 mg/dL (ref 100–199)
HDL: 50 mg/dL (ref >39)
LDL Chol Calc (NIH): 91 mg/dL (ref 0–99)
Triglycerides: 130 mg/dL (ref 0–149)
VLDL Cholesterol Cal: 23 mg/dL (ref 5–40)

## 2024-01-18 LAB — COMPREHENSIVE METABOLIC PANEL WITH GFR
ALT: 19 IU/L (ref 0–32)
AST: 22 IU/L (ref 0–40)
Albumin: 4.3 g/dL (ref 3.9–4.9)
Alkaline Phosphatase: 102 IU/L (ref 49–135)
BUN/Creatinine Ratio: 16 (ref 12–28)
BUN: 15 mg/dL (ref 8–27)
Bilirubin Total: 0.4 mg/dL (ref 0.0–1.2)
CO2: 19 mmol/L — ABNORMAL LOW (ref 20–29)
Calcium: 9 mg/dL (ref 8.7–10.3)
Chloride: 103 mmol/L (ref 96–106)
Creatinine, Ser: 0.92 mg/dL (ref 0.57–1.00)
Globulin, Total: 2 g/dL (ref 1.5–4.5)
Glucose: 104 mg/dL — ABNORMAL HIGH (ref 70–99)
Potassium: 4.3 mmol/L (ref 3.5–5.2)
Sodium: 136 mmol/L (ref 134–144)
Total Protein: 6.3 g/dL (ref 6.0–8.5)
eGFR: 67 mL/min/1.73 (ref >59)

## 2024-01-18 LAB — CBC WITH DIFFERENTIAL/PLATELET
Basophils Absolute: 0 x10E3/uL (ref 0.0–0.2)
Basos: 1 %
EOS (ABSOLUTE): 0.1 x10E3/uL (ref 0.0–0.4)
Eos: 1 %
Hematocrit: 36.3 % (ref 34.0–46.6)
Hemoglobin: 11.8 g/dL (ref 11.1–15.9)
Immature Grans (Abs): 0 x10E3/uL (ref 0.0–0.1)
Immature Granulocytes: 0 %
Lymphocytes Absolute: 1.8 x10E3/uL (ref 0.7–3.1)
Lymphs: 25 %
MCH: 30.3 pg (ref 26.6–33.0)
MCHC: 32.5 g/dL (ref 31.5–35.7)
MCV: 93 fL (ref 79–97)
Monocytes Absolute: 0.5 x10E3/uL (ref 0.1–0.9)
Monocytes: 7 %
Neutrophils Absolute: 5 x10E3/uL (ref 1.4–7.0)
Neutrophils: 66 %
Platelets: 230 x10E3/uL (ref 150–450)
RBC: 3.89 x10E6/uL (ref 3.77–5.28)
RDW: 13 % (ref 11.7–15.4)
WBC: 7.4 x10E3/uL (ref 3.4–10.8)

## 2024-01-18 LAB — VITAMIN D 25 HYDROXY (VIT D DEFICIENCY, FRACTURES): Vit D, 25-Hydroxy: 17.1 ng/mL — ABNORMAL LOW (ref 30.0–100.0)

## 2024-01-18 LAB — TSH: TSH: 0.891 u[IU]/mL (ref 0.450–4.500)

## 2024-02-22 ENCOUNTER — Emergency Department

## 2024-02-22 ENCOUNTER — Other Ambulatory Visit: Payer: Self-pay

## 2024-02-22 ENCOUNTER — Emergency Department
Admission: EM | Admit: 2024-02-22 | Discharge: 2024-02-22 | Disposition: A | Discharge status: Home / Self Care | Attending: Emergency Medicine | Admitting: Emergency Medicine

## 2024-02-22 DIAGNOSIS — S0990XA Unspecified injury of head, initial encounter: Secondary | ICD-10-CM | POA: Diagnosis not present

## 2024-02-22 DIAGNOSIS — S199XXA Unspecified injury of neck, initial encounter: Secondary | ICD-10-CM | POA: Diagnosis not present

## 2024-02-22 DIAGNOSIS — M25512 Pain in left shoulder: Secondary | ICD-10-CM | POA: Diagnosis not present

## 2024-02-22 DIAGNOSIS — Y92410 Unspecified street and highway as the place of occurrence of the external cause: Secondary | ICD-10-CM | POA: Insufficient documentation

## 2024-02-22 MED ORDER — ACETAMINOPHEN 325 MG PO TABS
650.0000 mg | ORAL_TABLET | Freq: Once | ORAL | Status: DC
Start: 2024-02-22 — End: 2024-02-22
  Filled 2024-02-22: qty 2

## 2024-02-22 MED ORDER — LIDOCAINE 5 % EX PTCH
1.0000 | MEDICATED_PATCH | Freq: Two times a day (BID) | CUTANEOUS | 0 refills | Status: AC
Start: 2024-02-22 — End: 2024-02-27

## 2024-02-22 MED ORDER — LIDOCAINE 5 % EX PTCH
1.0000 | MEDICATED_PATCH | CUTANEOUS | Status: DC
  Administered 2024-02-22: 1 via TRANSDERMAL
  Filled 2024-02-22: qty 1

## 2024-02-22 NOTE — Discharge Instructions (Signed)
 Your CT head and neck was normal.  Please follow-up with your outpatient provider.  You may take Tylenol/ibuprofen per package instructions to help with your symptoms.  Please return for any new, worsening, or changing symptoms or other concerns.  It was a pleasure caring for you today.

## 2024-02-22 NOTE — ED Notes (Signed)
 Pt discharged to home.  Instructions and medications reviewed.  Pt verbalized understanding, no questions at this time.
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
## 2024-03-19 ENCOUNTER — Other Ambulatory Visit: Payer: Self-pay | Admitting: Internal Medicine

## 2024-03-19 DIAGNOSIS — F320 Major depressive disorder, single episode, mild: Secondary | ICD-10-CM

## 2024-03-19 NOTE — Telephone Encounter (Unsigned)
 Copied from CRM #[id number]. Topic: Clinical - Medication Refill >> Mar 19, 2024 11:48 AM Fonda T wrote: Medication: escitalopram  (LEXAPRO ) 20 MG tablet  Pt has been out of medication for a week now.   Pt reports per pharmacy faxed over a refill request, but no response has been received by office, also pt sent message to provider, with no response.   Has the patient contacted their pharmacy? Yes, pharmacy advised to contact office.  This is the patient's preferred pharmacy:  [facility] [address] [place] - [address] [place] [address] Phone: [phone] Fax: [phone]   Is this the correct pharmacy for this prescription? Yes If no, delete pharmacy and type the correct one.   Has the prescription been filled recently? Yes  Is the patient out of the medication? Yes  Has the patient been seen for an appointment in the last year OR does the patient have an upcoming appointment? Yes  Can we respond through MyChart? No, prefers phone [phone]  Agent: Please be advised that Rx refills may take up to 3 business days. We ask that you follow-up with your pharmacy.

## 2024-03-20 ENCOUNTER — Telehealth: Payer: Self-pay

## 2024-03-20 DIAGNOSIS — F320 Major depressive disorder, single episode, mild: Secondary | ICD-10-CM

## 2024-03-20 MED ORDER — ESCITALOPRAM OXALATE 20 MG PO TABS: 20.0000 mg | ORAL_TABLET | Freq: Every day | ORAL | 1 refills | Status: AC

## 2024-03-20 NOTE — Telephone Encounter (Signed)
 RX refilled. Spoke with patient and informed her RX was sent in.  JM  Copied from CRM [id number]. Topic: Clinical - Medication Question >> Mar 20, 2024  8:48 AM Berwyn MATSU wrote: Reason for CRM: Patient called in requesting to speak to Dr. Justus and or nurse as she has been without her medication for over 1 week. Per patient she is going out of town on 03/22/24 and needs it be refilled.  I show refill request was submitted 03/19/24 per patient she also requested refill through my chart since 03/15/24.  Medication: escitalopram  (LEXAPRO ) 20 MG tablet  Patient is requesting a call back.   May you please assist.

## 2024-03-21 NOTE — Telephone Encounter (Signed)
 Duplicate request, refilled 05/22/23.  Requested Prescriptions  Pending Prescriptions Disp Refills   escitalopram  (LEXAPRO ) 20 MG tablet 90 tablet 1    Sig: Take 1 tablet (20 mg total) by mouth daily.     Psychiatry:  Antidepressants - SSRI Passed - 03/21/2024 12:08 PM      Passed - Completed PHQ-2 or PHQ-9 in the last 360 days      Passed - Valid encounter within last 6 months    Recent Outpatient Visits           2 months ago Annual physical exam   [facility] at [facility], Leita DEL, MD   8 months ago Acute hip pain, left   [facility] at [facility], Leita DEL, MD

## 2024-04-10 ENCOUNTER — Ambulatory Visit

## 2024-04-10 DIAGNOSIS — Z0000 Encounter for general adult medical examination without abnormal findings: Secondary | ICD-10-CM

## 2024-04-10 DIAGNOSIS — Z1231 Encounter for screening mammogram for malignant neoplasm of breast: Secondary | ICD-10-CM

## 2024-04-10 NOTE — Progress Notes (Signed)
 "  No chief complaint on file.    Subjective:   Sherry Gibson is a 68 y.o. female who presents for a Medicare Annual Wellness Visit.  Visit info / Clinical Intake: Medicare Wellness Visit Type:: Subsequent Annual Wellness Visit Persons participating in visit and providing information:: patient Medicare Wellness Visit Mode:: Telephone If telephone:: video declined Since this visit was completed virtually, some vitals may be partially provided or unavailable. Missing vitals are due to the limitations of the virtual format.: Unable to obtain vitals - no equipment If Telephone or Video please confirm:: I connected with patient using audio/video enable telemedicine. I verified patient identity with two identifiers, discussed telehealth limitations, and patient agreed to proceed. Patient Location:: home Provider Location:: office Interpreter Needed?: No Pre-visit prep was completed: yes AWV questionnaire completed by patient prior to visit?: no Living arrangements:: lives with spouse/significant other Patient's Overall Health Status Rating: good Typical amount of pain: some Does pain affect daily life?: no Are you currently prescribed opioids?: no  Dietary Habits and Nutritional Risks How many meals a day?: 3 Eats fruit and vegetables daily?: yes Most meals are obtained by: preparing own meals; eating out Diabetic:: no  Functional Status Activities of Daily Living (to include ambulation/medication): Independent Ambulation: Independent Medication Administration: Independent Home Management (perform basic housework or laundry): Independent Manage your own finances?: yes Primary transportation is: driving Concerns about vision?: no *vision screening is required for [facility]* ([facility]- DR.RITTER) Concerns about hearing?: no  Fall Screening Falls in the past year?: 0 Number of falls in past year: 0 Was there an injury with Fall?: 0 Fall Risk Category Calculator: 0 Patient Fall  Risk Level: Low Fall Risk  Fall Risk Patient at Risk for Falls Due to: No Fall Risks Fall risk Follow up: Falls evaluation completed; Falls prevention discussed  Home and Transportation Safety: All rugs have non-skid backing?: yes All stairs or steps have railings?: yes Grab bars in the bathtub or shower?: yes Have non-skid surface in bathtub or shower?: (!) no Good home lighting?: yes Regular seat belt use?: yes Hospital stays in the last year:: no  Cognitive Assessment Difficulty concentrating, remembering, or making decisions? : no Will 6CIT or Mini Cog be Completed: yes What year is it?: 0 points What month is it?: 0 points Give patient an address phrase to remember (5 components): [address] [place], [place] About what time is it?: 0 points Count backwards from 20 to 1: 0 points Say the months of the year in reverse: 0 points Repeat the address phrase from earlier: 0 points 6 CIT Score: 0 points  Advance Directives (For Healthcare) Does Patient Have a Medical Advance Directive?: No Does patient want to make changes to medical advance directive?: No - Patient declined Type of Advance Directive: Living will; Healthcare Power of Attorney Copy of Healthcare Power of Attorney in Chart?: No - copy requested Copy of Living Will in Chart?: No - copy requested Would patient like information on creating a medical advance directive?: No - Patient declined  Reviewed/Updated  Reviewed/Updated: Reviewed All (Medical, Surgical, Family, Medications, Allergies, Care Teams, Patient Goals)    Allergies (verified) Clarithromycin   Current Medications (verified) Outpatient Encounter Medications as of 04/10/2024  Medication Sig   atorvastatin  (LIPITOR) 10 MG tablet Take 1 tablet (10 mg total) by mouth daily.   escitalopram  (LEXAPRO ) 20 MG tablet Take 1 tablet (20 mg total) by mouth daily.   gabapentin  (NEURONTIN ) 100 MG capsule Take 1 capsule (100 mg total) by  mouth at bedtime.    oxybutynin  (DITROPAN -XL) 5 MG 24 hr tablet Take 1 tablet (5 mg total) by mouth at bedtime.   pantoprazole  (PROTONIX ) 40 MG tablet Take 1 tablet (40 mg total) by mouth daily.   valACYclovir  (VALTREX ) 1000 MG tablet Take 1,000 mg by mouth 2 (two) times daily.   ALPRAZolam  (XANAX ) 0.25 MG tablet Take 1 tablet (0.25 mg total) by mouth 2 (two) times daily as needed for anxiety. (Patient not taking: Reported on 04/10/2024)   No facility-administered encounter medications on file as of 04/10/2024.    History: Past Medical History:  Diagnosis Date   Anxiety    Arthritis    Depression    Elevated cholesterol    GERD (gastroesophageal reflux disease)    Past Surgical History:  Procedure Laterality Date   ABDOMINAL HYSTERECTOMY     had irregular bleeding- still have ovaries   Family History  Problem Relation Age of Onset   Cancer Father    COPD Father    Heart disease Father    Uterine cancer Maternal Grandmother    Heart disease Maternal Grandfather    Breast cancer Paternal Grandmother    Heart disease Paternal Grandmother    Heart disease Paternal Grandfather    Social History   Occupational History   Not on file  Tobacco Use   Smoking status: Never   Smokeless tobacco: Never  Vaping Use   Vaping status: Never Used  Substance and Sexual Activity   Alcohol use: Never   Drug use: Never   Sexual activity: Yes    Partners: Male   Tobacco Counseling Counseling given: Not Answered  SDOH Screenings   Food Insecurity: No Food Insecurity (04/10/2024)  Housing: Unknown (04/10/2024)  Transportation Needs: No Transportation Needs (04/10/2024)  Utilities: Not At Risk (04/10/2024)  Alcohol Screen: Low Risk (01/25/2023)  Depression (PHQ2-9): Low Risk (04/10/2024)  Financial Resource Strain: Low Risk (04/09/2024)  Physical Activity: Sufficiently Active (04/10/2024)  Social Connections: Socially Integrated (04/10/2024)  Stress: No Stress Concern Present (04/10/2024)  Recent  Concern: Stress - Stress Concern Present (04/09/2024)  Tobacco Use: Low Risk (04/10/2024)  Health Literacy: Adequate Health Literacy (04/10/2024)   See flowsheets for full screening details  Depression Screen PHQ 2 & 9 Depression Scale- Over the past 2 weeks, how often have you been bothered by any of the following problems? Little interest or pleasure in doing things: 0 Feeling down, depressed, or hopeless (PHQ Adolescent also includes...irritable): 0 PHQ-2 Total Score: 0 Trouble falling or staying asleep, or sleeping too much: 0 Feeling tired or having little energy: 0 Poor appetite or overeating (PHQ Adolescent also includes...weight loss): 0 Feeling bad about yourself - or that you are a failure or have let yourself or your family down: 0 Trouble concentrating on things, such as reading the newspaper or watching television (PHQ Adolescent also includes...like school work): 0 Moving or speaking so slowly that other people could have noticed. Or the opposite - being so fidgety or restless that you have been moving around a lot more than usual: 0 Thoughts that you would be better off dead, or of hurting yourself in some way: 0 PHQ-9 Total Score: 0 If you checked off any problems, how difficult have these problems made it for you to do your work, take care of things at home, or get along with other people?: Not difficult at all  Depression Treatment Depression Interventions/Treatment : EYV7-0 Score <4 Follow-up Not Indicated     Goals Addressed  This Visit's Progress    DIET - INCREASE WATER INTAKE               Objective:    There were no vitals filed for this visit. There is no height or weight on file to calculate BMI.  Hearing/Vision screen Hearing Screening - Comments:: NO AIDS Vision Screening - Comments:: WEARS GLASSES- DR.RITTER, SEEN EVERY YEAR Immunizations and Health Maintenance Health Maintenance  Topic Date Due   COVID-19 Vaccine (4 - 2025-26  season) 12/11/2023   Influenza Vaccine  07/09/2024 (Originally 11/10/2023)   Mammogram  07/18/2024   Colonoscopy  11/30/2024   Medicare Annual Wellness (AWV)  04/10/2025   DTaP/Tdap/Td (2 - Td or Tdap) 09/15/2031   Pneumococcal Vaccine: 50+ Years  Completed   Bone Density Scan  Completed   Hepatitis C Screening  Completed   Zoster Vaccines- Shingrix  Completed   Meningococcal B Vaccine  Aged Out        Assessment/Plan:  This is a routine wellness examination for Micha.  Patient Care Team: Justus Leita DEL, MD as PCP - General (Internal Medicine) Clair Pries, MD as Referring Physician (Obstetrics and Gynecology) Sebastian Ellison (Dermatology) Lucio Franky PARAS, OD (Optometry)  I have personally reviewed and noted the following in the patients chart:   Medical and social history Use of alcohol, tobacco or illicit drugs  Current medications and supplements including opioid prescriptions. Functional ability and status Nutritional status Physical activity Advanced directives List of other physicians Hospitalizations, surgeries, and ER visits in previous 12 months Vitals Screenings to include cognitive, depression, and falls Referrals and appointments  No orders of the defined types were placed in this encounter.  In addition, I have reviewed and discussed with patient certain preventive protocols, quality metrics, and best practice recommendations. A written personalized care plan for preventive services as well as general preventive health recommendations were provided to patient.   Jhonnie GORMAN Das, LPN   [id number]   Return in 1 year (on 04/10/2025).  After Visit Summary: (MyChart) Due to this being a telephonic visit, the after visit summary with patients personalized plan was offered to patient via MyChart   Nurse Notes: UTD ON SHOTS; ORDERED MAMMOGRAM; UTD ON COLONOSCOPY; UTD ON BDS   "

## 2024-04-10 NOTE — Patient Instructions (Addendum)
 Ms. Sherry Gibson,  Thank you for taking the time for your Medicare Wellness Visit. I appreciate your continued commitment to your health goals. Please review the care plan we discussed, and feel free to reach out if I can assist you further.  Please note that Annual Wellness Visits do not include a physical exam. Some assessments may be limited, especially if the visit was conducted virtually. If needed, we may recommend an in-person follow-up with your provider.  Ongoing Care Seeing your primary care provider every 3 to 6 months helps us  monitor your health and provide consistent, personalized care. 07/18/24 @ 8:40 A.M. W/ DR.LIANG  Referrals If a referral was made during today's visit and you haven't received any updates within two weeks, please contact the referred provider directly to check on the status.  REFERRAL SENT FOR MAMMOGRAM IN [place]  Recommended Screenings:  Health Maintenance  Topic Date Due   COVID-19 Vaccine (4 - 2025-26 season) 12/11/2023   Breast Cancer Screening  07/18/2024   Colon Cancer Screening  11/30/2024   Osteoporosis screening with Bone Density Scan  01/28/2025   Medicare Annual Wellness Visit  04/10/2025   DTaP/Tdap/Td vaccine (2 - Td or Tdap) 09/15/2031   Pneumococcal Vaccine for age over 55  Completed   Flu Shot  Completed   Hepatitis C Screening  Completed   Zoster (Shingles) Vaccine  Completed   Meningitis B Vaccine  Aged Out    Vision: Annual vision screenings are recommended for early detection of glaucoma, cataracts, and diabetic retinopathy. These exams can also reveal signs of chronic conditions such as diabetes and high blood pressure.  Dental: Annual dental screenings help detect early signs of oral cancer, gum disease, and other conditions linked to overall health, including heart disease and diabetes.  Please see the attached documents for additional preventive care recommendations.   NEXT AWV  04/24/25 @ 1:50 PM BY VIDEO

## 2024-07-18 ENCOUNTER — Encounter: Admitting: Student

## 2025-04-24 ENCOUNTER — Ambulatory Visit
